# Patient Record
Sex: Male | Born: 1965 | Race: White | Hispanic: No | Smoking: Never smoker
Health system: Southern US, Community
[De-identification: ages and names within clinical notes are randomized; demographics above are authoritative.]

## PROBLEM LIST (undated history)

## (undated) DIAGNOSIS — K3580 Unspecified acute appendicitis: Secondary | ICD-10-CM

## (undated) DIAGNOSIS — R5383 Other fatigue: Secondary | ICD-10-CM

## (undated) DIAGNOSIS — K2970 Gastritis, unspecified, without bleeding: Secondary | ICD-10-CM

## (undated) DIAGNOSIS — T7840XA Allergy, unspecified, initial encounter: Secondary | ICD-10-CM

## (undated) DIAGNOSIS — C109 Malignant neoplasm of oropharynx, unspecified: Secondary | ICD-10-CM

## (undated) DIAGNOSIS — R634 Abnormal weight loss: Secondary | ICD-10-CM

## (undated) DIAGNOSIS — Z923 Personal history of irradiation: Secondary | ICD-10-CM

## (undated) DIAGNOSIS — Z8669 Personal history of other diseases of the nervous system and sense organs: Secondary | ICD-10-CM

## (undated) DIAGNOSIS — R51 Headache: Secondary | ICD-10-CM

## (undated) DIAGNOSIS — K1231 Oral mucositis (ulcerative) due to antineoplastic therapy: Secondary | ICD-10-CM

## (undated) DIAGNOSIS — Z85819 Personal history of malignant neoplasm of unspecified site of lip, oral cavity, and pharynx: Secondary | ICD-10-CM

## (undated) DIAGNOSIS — J3489 Other specified disorders of nose and nasal sinuses: Secondary | ICD-10-CM

## (undated) DIAGNOSIS — G43909 Migraine, unspecified, not intractable, without status migrainosus: Secondary | ICD-10-CM

## (undated) DIAGNOSIS — F329 Major depressive disorder, single episode, unspecified: Secondary | ICD-10-CM

## (undated) DIAGNOSIS — F32A Depression, unspecified: Secondary | ICD-10-CM

## (undated) DIAGNOSIS — K219 Gastro-esophageal reflux disease without esophagitis: Secondary | ICD-10-CM

## (undated) DIAGNOSIS — F419 Anxiety disorder, unspecified: Secondary | ICD-10-CM

## (undated) HISTORY — DX: Other fatigue: R53.83

## (undated) HISTORY — DX: Malignant neoplasm of oropharynx, unspecified: C10.9

## (undated) HISTORY — DX: Anxiety disorder, unspecified: F41.9

## (undated) HISTORY — DX: Depression, unspecified: F32.A

## (undated) HISTORY — DX: Migraine, unspecified, not intractable, without status migrainosus: G43.909

## (undated) HISTORY — DX: Personal history of irradiation: Z92.3

## (undated) HISTORY — DX: Oral mucositis (ulcerative) due to antineoplastic therapy: K12.31

## (undated) HISTORY — DX: Headache: R51

## (undated) HISTORY — DX: Major depressive disorder, single episode, unspecified: F32.9

## (undated) HISTORY — DX: Other specified disorders of nose and nasal sinuses: J34.89

## (undated) HISTORY — PX: NASAL SINUS SURGERY: SHX719

## (undated) HISTORY — PX: PEG TUBE PLACEMENT: SUR1034

## (undated) HISTORY — DX: Gastritis, unspecified, without bleeding: K29.70

## (undated) HISTORY — PX: PEG TUBE REMOVAL: SHX2187

## (undated) HISTORY — DX: Allergy, unspecified, initial encounter: T78.40XA

## (undated) HISTORY — DX: Abnormal weight loss: R63.4

---

## 1998-11-13 ENCOUNTER — Encounter: Payer: Self-pay | Admitting: Gastroenterology

## 1998-11-13 ENCOUNTER — Encounter: Payer: Self-pay | Admitting: Family Medicine

## 1998-11-13 ENCOUNTER — Other Ambulatory Visit: Admission: RE | Admit: 1998-11-13 | Discharge: 1998-11-13 | Payer: Self-pay | Admitting: Gastroenterology

## 2001-08-24 ENCOUNTER — Encounter: Payer: Self-pay | Admitting: Family Medicine

## 2001-08-24 ENCOUNTER — Encounter: Admission: RE | Admit: 2001-08-24 | Discharge: 2001-08-24 | Payer: Self-pay | Admitting: Family Medicine

## 2002-09-10 ENCOUNTER — Ambulatory Visit (HOSPITAL_COMMUNITY): Admission: RE | Admit: 2002-09-10 | Discharge: 2002-09-10 | Payer: Self-pay | Admitting: *Deleted

## 2002-09-10 ENCOUNTER — Encounter (INDEPENDENT_AMBULATORY_CARE_PROVIDER_SITE_OTHER): Payer: Self-pay | Admitting: Specialist

## 2003-05-14 ENCOUNTER — Encounter: Payer: Self-pay | Admitting: Family Medicine

## 2003-05-14 ENCOUNTER — Encounter: Admission: RE | Admit: 2003-05-14 | Discharge: 2003-05-14 | Payer: Self-pay | Admitting: Family Medicine

## 2003-06-28 ENCOUNTER — Encounter: Payer: Self-pay | Admitting: Gastroenterology

## 2003-08-20 ENCOUNTER — Encounter: Payer: Self-pay | Admitting: Gastroenterology

## 2004-09-07 ENCOUNTER — Ambulatory Visit: Payer: Self-pay | Admitting: Family Medicine

## 2004-10-23 ENCOUNTER — Ambulatory Visit: Payer: Self-pay | Admitting: Internal Medicine

## 2005-01-05 ENCOUNTER — Ambulatory Visit: Payer: Self-pay | Admitting: Family Medicine

## 2005-02-26 ENCOUNTER — Ambulatory Visit: Payer: Self-pay | Admitting: Family Medicine

## 2005-03-02 ENCOUNTER — Ambulatory Visit: Payer: Self-pay | Admitting: Family Medicine

## 2005-05-11 ENCOUNTER — Ambulatory Visit: Payer: Self-pay | Admitting: Family Medicine

## 2005-12-20 ENCOUNTER — Ambulatory Visit: Payer: Self-pay | Admitting: Family Medicine

## 2007-06-23 DIAGNOSIS — R51 Headache: Secondary | ICD-10-CM

## 2007-06-23 DIAGNOSIS — R519 Headache, unspecified: Secondary | ICD-10-CM | POA: Insufficient documentation

## 2007-06-23 DIAGNOSIS — J309 Allergic rhinitis, unspecified: Secondary | ICD-10-CM | POA: Insufficient documentation

## 2007-09-01 ENCOUNTER — Encounter: Payer: Self-pay | Admitting: Family Medicine

## 2007-09-26 ENCOUNTER — Telehealth: Payer: Self-pay | Admitting: Family Medicine

## 2007-09-26 ENCOUNTER — Ambulatory Visit: Payer: Self-pay | Admitting: Family Medicine

## 2007-09-26 DIAGNOSIS — M79609 Pain in unspecified limb: Secondary | ICD-10-CM

## 2008-11-08 ENCOUNTER — Ambulatory Visit: Payer: Self-pay | Admitting: Family Medicine

## 2008-11-08 DIAGNOSIS — K589 Irritable bowel syndrome without diarrhea: Secondary | ICD-10-CM

## 2008-11-12 ENCOUNTER — Telehealth: Payer: Self-pay | Admitting: *Deleted

## 2008-11-26 ENCOUNTER — Ambulatory Visit: Payer: Self-pay | Admitting: Gastroenterology

## 2009-01-13 ENCOUNTER — Ambulatory Visit: Payer: Self-pay | Admitting: Family Medicine

## 2009-01-13 DIAGNOSIS — R079 Chest pain, unspecified: Secondary | ICD-10-CM | POA: Insufficient documentation

## 2009-07-22 ENCOUNTER — Ambulatory Visit: Payer: Self-pay | Admitting: Family Medicine

## 2009-07-22 DIAGNOSIS — M542 Cervicalgia: Secondary | ICD-10-CM

## 2009-10-17 ENCOUNTER — Ambulatory Visit: Payer: Self-pay | Admitting: Family Medicine

## 2009-10-17 DIAGNOSIS — L301 Dyshidrosis [pompholyx]: Secondary | ICD-10-CM

## 2009-10-17 DIAGNOSIS — B369 Superficial mycosis, unspecified: Secondary | ICD-10-CM | POA: Insufficient documentation

## 2010-08-11 ENCOUNTER — Ambulatory Visit: Payer: Self-pay | Admitting: Family Medicine

## 2010-08-11 LAB — CONVERTED CEMR LAB
ALT: 51 units/L (ref 0–53)
AST: 37 units/L (ref 0–37)
Albumin: 4.5 g/dL (ref 3.5–5.2)
Alkaline Phosphatase: 105 units/L (ref 39–117)
BUN: 16 mg/dL (ref 6–23)
Basophils Absolute: 0 10*3/uL (ref 0.0–0.1)
Basophils Relative: 0.4 % (ref 0.0–3.0)
Bilirubin Urine: NEGATIVE
Bilirubin, Direct: 0.1 mg/dL (ref 0.0–0.3)
Blood in Urine, dipstick: NEGATIVE
CO2: 31 meq/L (ref 19–32)
Calcium: 9.2 mg/dL (ref 8.4–10.5)
Chloride: 98 meq/L (ref 96–112)
Cholesterol: 169 mg/dL (ref 0–200)
Creatinine, Ser: 1 mg/dL (ref 0.4–1.5)
Direct LDL: 103 mg/dL
Eosinophils Absolute: 0.1 10*3/uL (ref 0.0–0.7)
Eosinophils Relative: 1.4 % (ref 0.0–5.0)
GFR calc non Af Amer: 89.54 mL/min (ref >60)
Glucose, Bld: 71 mg/dL (ref 70–99)
Glucose, Urine, Semiquant: NEGATIVE
HCT: 44 % (ref 39.0–52.0)
HDL: 31.9 mg/dL — ABNORMAL LOW (ref >39.00)
Hemoglobin: 15 g/dL (ref 13.0–17.0)
Lymphocytes Relative: 19.8 % (ref 12.0–46.0)
Lymphs Abs: 1.6 10*3/uL (ref 0.7–4.0)
MCHC: 34 g/dL (ref 30.0–36.0)
MCV: 91.5 fL (ref 78.0–100.0)
Monocytes Absolute: 0.8 10*3/uL (ref 0.1–1.0)
Monocytes Relative: 10.7 % (ref 3.0–12.0)
Neutro Abs: 5.3 10*3/uL (ref 1.4–7.7)
Neutrophils Relative %: 67.7 % (ref 43.0–77.0)
Nitrite: NEGATIVE
Platelets: 193 10*3/uL (ref 150.0–400.0)
Potassium: 4 meq/L (ref 3.5–5.1)
Protein, U semiquant: NEGATIVE
RBC: 4.81 M/uL (ref 4.22–5.81)
RDW: 12.7 % (ref 11.5–14.6)
Sodium: 138 meq/L (ref 135–145)
Specific Gravity, Urine: 1.02
TSH: 0.98 microintl units/mL (ref 0.35–5.50)
Total Bilirubin: 0.5 mg/dL (ref 0.3–1.2)
Total CHOL/HDL Ratio: 5
Total Protein: 7.3 g/dL (ref 6.0–8.3)
Triglycerides: 209 mg/dL — ABNORMAL HIGH (ref 0.0–149.0)
Urobilinogen, UA: 0.2
VLDL: 41.8 mg/dL — ABNORMAL HIGH (ref 0.0–40.0)
WBC Urine, dipstick: NEGATIVE
WBC: 7.8 10*3/uL (ref 4.5–10.5)
pH: 7.5

## 2010-08-27 ENCOUNTER — Ambulatory Visit: Payer: Self-pay | Admitting: Family Medicine

## 2010-08-27 ENCOUNTER — Encounter: Payer: Self-pay | Admitting: Family Medicine

## 2010-11-01 DIAGNOSIS — C109 Malignant neoplasm of oropharynx, unspecified: Secondary | ICD-10-CM

## 2010-11-01 DIAGNOSIS — R634 Abnormal weight loss: Secondary | ICD-10-CM

## 2010-11-01 HISTORY — DX: Malignant neoplasm of oropharynx, unspecified: C10.9

## 2010-11-01 HISTORY — DX: Abnormal weight loss: R63.4

## 2010-12-03 NOTE — Assessment & Plan Note (Signed)
Summary: refill on medicine//lch   Vital Signs:  Patient profile:   45 year old male Weight:      190 pounds BMI:     27.36 Temp:     98.3 degrees F oral Pulse rate:   96 / minute Pulse rhythm:   regular BP sitting:   138 / 90  (left arm) Cuff size:   regular  Vitals Entered By: Mervin Hack CMA Duncan Dull) (August 11, 2010 11:49 AM) CC: medication refill   CC:  medication refill.  History of Present Illness: Jason Luna is a 45 year old male, divorced, who comes in today for renewing his medication to get set up for physical get a flu shot.  He would also like an HIV.  He recently had a relationship with a lady that didn't work out and just wants to be sure.  He takes defecating, 250 mg daily, and Elavil 50 nightly to prevent migraines.  He has a rare migraine, but when he does he usually only about once a month.  He takes Maxalt and Phenergan if he gets nauseated.  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 Benjiman Core  Past History:  Past medical, surgical, family and social histories (including risk factors) reviewed for relevance to current acute and chronic problems.  Past Medical History: Reviewed history from 11/22/2008 and no changes required. Allergic rhinitis Headache Gastritis  Past Surgical History: Reviewed history from 06/23/2007 and no changes required. Sinus surgery  Family History: Reviewed history from 11/22/2008 and no changes required. Family History of Asthma Family History of Prostate CA 1st degree relative <50 Family History of Skin cancer Fam hx Crohns dx-Mother Fam hx Leukemia  Social History: Reviewed history from 11/08/2008 and no changes required. Married Never Smoked Occupation: Alcohol use-no Drug use-ex  Review of Systems      See HPI  Physical Exam  General:  Well-developed,well-nourished,in  no acute distress; alert,appropriate and cooperative throughout examination   Impression & Recommendations:  Problem # 1:  HEADACHE (ICD-784.0) Assessment Improved  His updated medication list for this problem includes:    Maxalt 10 Mg Tabs (Rizatriptan benzoate) .Marland Kitchen... As needed for mha  Orders: Venipuncture (57322) TLB-Lipid Panel (80061-LIPID) TLB-BMP (Basic Metabolic Panel-BMET) (80048-METABOL) TLB-CBC Platelet - w/Differential (85025-CBCD) TLB-Hepatic/Liver Function Pnl (80076-HEPATIC) TLB-TSH (Thyroid Stimulating Hormone) (84443-TSH) T-HIV Antibody  (Reflex) (02542-70623) Prescription Created Electronically [phone]) UA Dipstick w/o Micro (manual) (15176)  Complete Medication List: 1)  Depakote 250 Mg Tbec (Divalproex sodium) .Marland Kitchen.. 1 tab at bedtime 2)  Promethazine Hcl 25 Mg Tabs (Promethazine hcl) .... As needed for mha 3)  Maxalt 10 Mg Tabs (Rizatriptan benzoate) .... As needed for mha 4)  Epipen 2-pak 0.3 Mg/0.70ml (1:1000) Devi (Epinephrine hcl (anaphylaxis)) .... As needed 5)  Amitriptyline Hcl 50 Mg Tabs (Amitriptyline hcl) .Marland Kitchen.. 1 tab @ bedtime  Other Orders: Flu Vaccine 52yrs + (16073) Admin 1st Vaccine (71062) Admin 1st Vaccine (State) [phone]) Specimen Handling (62703)  Patient Instructions: 1)  continue current medication.  Set up a 30 minute appointment sometime in the next couple weeks for a general physical exam.  All your laboratory done today. 2)  I will review your lab work with you when you come back.  If there is anything unusual.  I will call you immediately Prescriptions: AMITRIPTYLINE HCL 50 MG TABS (AMITRIPTYLINE HCL) 1 tab @ bedtime  #100 x  3   Entered and Authorized by:   Roderick Pee MD   Signed by:   Roderick Pee MD on 08/11/2010   Method used:   Electronically to        [facility]. [phone]* (retail)       [phone]       [place], [place]  [address]       Ph: [phone]       Fax: [phone]    RxID:   [id number] EPIPEN 2-PAK 0.3 MG/0.3ML (1:1000)  DEVI (EPINEPHRINE HCL (ANAPHYLAXIS)) as needed  #2 x 4   Entered and Authorized by:   Roderick Pee MD   Signed by:   Roderick Pee MD on 08/11/2010   Method used:   Electronically to        [facility]. [phone]* (retail)       [phone]       [place], [place]  [address]       Ph: [phone]       Fax: [phone]   RxID:   [id number] MAXALT 10 MG  TABS (RIZATRIPTAN BENZOATE) as needed for MHA  #6 x 11   Entered and Authorized by:   Roderick Pee MD   Signed by:   Roderick Pee MD on 08/11/2010   Method used:   Electronically to        [facility]. [phone]* (retail)       [phone] [address]       [place], [place]  [address]       Ph: [phone]       Fax: [phone]   RxID:   [id number] PROMETHAZINE HCL 25 MG  TABS (PROMETHAZINE HCL) as needed for MHA  #30 x 4   Entered and Authorized by:   Roderick Pee MD   Signed by:   Roderick Pee MD on 08/11/2010   Method used:   Electronically to        [facility]. [phone]* (retail)       [phone] [address]       [place], [place]  [address]       Ph: [phone]       Fax: [phone]   RxID:   [id number] DEPAKOTE 250 MG  TBEC (DIVALPROEX SODIUM) 1 tab at bedtime  #100 x 3   Entered and Authorized by:   Roderick Pee MD   Signed by:   Roderick Pee MD on 08/11/2010   Method used:   Electronically to        [facility]. [phone]* (retail)       [phone] [address]       [place], [place]  [address]       Ph: [phone]       Fax: [phone]   RxID:   [id number]   Current Allergies (reviewed today): ! * ALIEVE   Influenza Vaccine    Vaccine Type: Fluvax 3+    Site: left deltoid    Mfr: GlaxoSmithKline    Dose: 0.5 ml    Route: IM    Given by: Mervin Hack CMA (AAMA)    Exp. Date:  05/01/2011  Lot #: [id number]    VIS given: 05/26/10 version given August 11, 2010.  Flu Vaccine Consent Questions    Do you have a history of severe allergic reactions to this vaccine? no    Any prior history of allergic reactions to egg and/or gelatin? no    Do you have a sensitivity to the preservative Thimersol? no    Do you have a past history of Guillan-Barre Syndrome? no    Do you currently have an acute febrile illness? no    Have you ever had a severe reaction to latex? no    Vaccine information given and explained to patient? yes  Laboratory Results   Urine Tests    Routine Urinalysis   Color: yellow Appearance: Clear Glucose: negative   (Normal Range: Negative) Bilirubin: negative   (Normal Range: Negative) Ketone: trace (5)   (Normal Range: Negative) Spec. Gravity: 1.020   (Normal Range: 1.003-1.035) Blood: negative   (Normal Range: Negative) pH: 7.5   (Normal Range: 5.0-8.0) Protein: negative   (Normal Range: Negative) Urobilinogen: 0.2   (Normal Range: 0-1) Nitrite: negative   (Normal Range: Negative) Leukocyte Esterace: negative   (Normal Range: Negative)    Comments: Rita Ohara  August 11, 2010 4:07 PM

## 2010-12-03 NOTE — Assessment & Plan Note (Signed)
Summary: 30cpx/mm/pt rsc/cjr   Vital Signs:  Patient profile:   45 year old male Height:      70 inches Weight:      191 pounds BMI:     27.50 Temp:     99.3 degrees F oral BP sitting:   140 / 90  (left arm) Cuff size:   regular  Vitals Entered By: Kern Reap CMA Duncan Dull) (August 27, 2010 2:47 PM) CC: cpx Is Patient Diabetic? No   CC:  cpx.  History of Present Illness: Jason Luna is a 45 year old, divorced male, nonsmoker, who comes in today for general physical examination  His biggest long-term medical problem is the migraine headaches.  He takes Elavil 50 mg nightly, defecate, 250 milligrams nightly Maxalt p.r.n.  In the last month.  He said for migraines.  The Maxalt helps and he always takes immediately however, the last one he had on Wednesday lasted about 3 to 4 hours for finally went away.  He gets routine eye care, dental care, tetanus 99.........Marland Kitchen booster today......... seasonal flu shot 2011.  His father and grandfather had prostate cancer.  Therefore, [facility].  Exam /PSA have all been normal.  Three months ago.  He was given a testosterone supplement.  Information given about pluses and minuses  Allergies: 1)  ! Benjiman Core  Past History:  Past medical, surgical, family and social histories (including risk factors) reviewed, and no changes noted (except as noted below).  Past Medical History: Reviewed history from 11/22/2008 and no changes required. Allergic rhinitis Headache Gastritis  Past Surgical History: Reviewed history from 06/23/2007 and no changes required. Sinus surgery  Family History: Reviewed history from 11/22/2008 and no changes required. Family History of Asthma Family History of Prostate CA 1st degree relative <50 Family History of Skin cancer Fam hx Crohns dx-Mother Fam hx Leukemia  Social History: Reviewed history from 11/08/2008 and no changes required. Never  Smoked Occupation: Alcohol use-no Drug use-ex Divorced  Review of Systems      See HPI  Physical Exam  General:  Well-developed,well-nourished,in no acute distress; alert,appropriate and cooperative throughout examination Head:  Normocephalic and atraumatic without obvious abnormalities. No apparent alopecia or balding. Eyes:  No corneal or conjunctival inflammation noted. EOMI. Perrla. Funduscopic exam benign, without hemorrhages, exudates or papilledema. Vision grossly normal. Ears:  External ear exam shows no significant lesions or deformities.  Otoscopic examination reveals clear canals, tympanic membranes are intact bilaterally without bulging, retraction, inflammation or discharge. Hearing is grossly normal bilaterally. Nose:  External nasal examination shows no deformity or inflammation. Nasal mucosa are pink and moist without lesions or exudates. Mouth:  Oral mucosa and oropharynx without lesions or exudates.  Teeth in good repair. Neck:  No deformities, masses, or tenderness noted. Chest Wall:  No deformities, masses, tenderness or gynecomastia noted. Breasts:  No masses or gynecomastia noted Lungs:  Normal respiratory effort, chest expands symmetrically. Lungs are clear to auscultation, no crackles or wheezes. Heart:  Normal rate and regular rhythm. S1 and S2 normal without gallop, murmur, click, rub or other extra sounds. Abdomen:  Bowel sounds positive,abdomen soft and non-tender without masses, organomegaly or hernias noted. Rectal:  uro Genitalia:  Testes bilaterally descended without nodularity, tenderness or masses. No scrotal masses or lesions. No penis lesions or urethral discharge. Prostate:  uro Msk:  No deformity or scoliosis noted of thoracic or lumbar spine.   Pulses:  R and L carotid,radial,femoral,dorsalis pedis and posterior  tibial pulses are full and equal bilaterally Extremities:  No clubbing, cyanosis, edema, or deformity noted with normal full range of motion  of all joints.   Neurologic:  No cranial nerve deficits noted. Station and gait are normal. Plantar reflexes are down-going bilaterally. DTRs are symmetrical throughout. Sensory, motor and coordinative functions appear intact. Skin:  Intact without suspicious lesions or rashes Cervical Nodes:  No lymphadenopathy noted Axillary Nodes:  No palpable lymphadenopathy Inguinal Nodes:  No significant adenopathy Psych:  Cognition and judgment appear intact. Alert and cooperative with normal attention span and concentration. No apparent delusions, illusions, hallucinations   Impression & Recommendations:  Problem # 1:  ROUTINE GENERAL MEDICAL EXAM@HEALTH  CARE FACL (ICD-V70.0) Assessment Unchanged  Orders: EKG w/ Interpretation (93000)  Problem # 2:  HEADACHE (ICD-784.0) Assessment: Deteriorated  His updated medication list for this problem includes:    Maxalt 10 Mg Tabs (Rizatriptan benzoate) .Marland Kitchen... As needed for mha  Complete Medication List: 1)  Depakote 250 Mg Tbec (Divalproex sodium) .Marland Kitchen.. 1 tab at bedtime 2)  Promethazine Hcl 25 Mg Tabs (Promethazine hcl) .... As needed for mha 3)  Maxalt 10 Mg Tabs (Rizatriptan benzoate) .... As needed for mha 4)  Epipen 2-pak 0.3 Mg/0.52ml (1:1000) Devi (Epinephrine hcl (anaphylaxis)) .... As needed 5)  Amitriptyline Hcl 75 Mg Tabs (Amitriptyline hcl) .Marland Kitchen.. 1 tab @ bedtime 6)  Androgel Pump 1.25 Gm/act (1%) Gel (Testosterone) .... Uad via uro  Other Orders: Prescription Created Electronically [phone]) Tdap => 61yrs IM (60454) Admin 1st Vaccine (09811)  Patient Instructions: 1)  increase the elavil  to 75 mg a day at bedtime.........continue the Maxalt, and immediately at the onset of a migraine.  If we don't see improvement over the next month, then we would consider increasing the Elavil to 100 mg a day at bedtime or we can increase to depocate   Call if you have a problem 2)  Please schedule a follow-up appointment in 1 year. 3)  It is important that  you exercise regularly at least 20 minutes 5 times a week. If you develop chest pain, have severe difficulty breathing, or feel very tired , stop exercising immediately and seek medical attention. 4)  Take an Aspirin every day. 5)  Another good source of information about hormone replacement is Web- M.D. Prescriptions: AMITRIPTYLINE HCL 75 MG TABS (AMITRIPTYLINE HCL) 1 tab @ bedtime  #100 x 3   Entered and Authorized by:   Roderick Pee MD   Signed by:   Roderick Pee MD on 08/27/2010   Method used:   Electronically to        [facility]. [phone]* (retail)       [phone] [address]       [place], [place]  [address]       Ph: [phone]       Fax: [phone]   RxID:   [id number]    Orders Added: 1)  Prescription Created Electronically [G8553] 2)  Est. Patient 40-64 years [99396] 3)  EKG w/ Interpretation [93000] 4)  Tdap => 24yrs IM [90715] 5)  Admin 1st Vaccine [25956]   Immunizations Administered:  Tetanus Vaccine:    Vaccine Type: Tdap    Site: left deltoid    Mfr: GlaxoSmithKline    Dose: 0.5 ml    Route: IM    Given by: Kern Reap CMA (AAMA)    Exp. Date: 08/20/2012    Lot #: [id number]  Physician counseled: yes   Immunizations Administered:  Tetanus Vaccine:    Vaccine Type: Tdap    Site: left deltoid    Mfr: GlaxoSmithKline    Dose: 0.5 ml    Route: IM    Given by: Kern Reap CMA (AAMA)    Exp. Date: 08/20/2012    Lot #: [id number]    Physician counseled: yes

## 2011-03-02 ENCOUNTER — Ambulatory Visit (INDEPENDENT_AMBULATORY_CARE_PROVIDER_SITE_OTHER): Payer: 59 | Admitting: Family Medicine

## 2011-03-02 ENCOUNTER — Encounter: Payer: Self-pay | Admitting: Family Medicine

## 2011-03-02 DIAGNOSIS — L723 Sebaceous cyst: Secondary | ICD-10-CM

## 2011-03-02 DIAGNOSIS — IMO0002 Reserved for concepts with insufficient information to code with codable children: Secondary | ICD-10-CM

## 2011-03-02 NOTE — Progress Notes (Signed)
  Subjective:    Patient ID: Jason Luna, male    DOB: 07/30/1966, 45 y.o.   MRN: [id number]  HPI  Jason Luna is a 45 y/o div. Male nonsmoker who comes in  For evaluation of a knot on the right side of his neck x 3 weeks.  About 3 weeks he noticed a soreness in the right side of his neck and felt an enlarged lesion.  He says it increases in size and seems to shrink down the leg, but then gets bigger again.  He said no fever, chills, shortness of breath, chest pain, weight loss, recent dental work, Catering manager.    Review of Systems General review of systems negative    Objective:   Physical Exam    Will punish menarche distress.  Examination of the neck shows a marble-sized lesion just anterior to the parotid is soft is movable and not fixed to    Assessment & Plan:  Cystic lesion observed in P. Consult with Dr. Ezzard Standing, p.r.n.

## 2011-03-02 NOTE — Patient Instructions (Signed)
If the lesion does not resolve, then I would recommend you go see Dr. Narda Bonds.  ENT for consult.

## 2011-03-19 NOTE — Op Note (Signed)
   NAME:  Jason Luna, Jason Luna                       ACCOUNT NO.:  [id number]   MEDICAL RECORD NO.:  [id number]                   PATIENT TYPE:  OUT   LOCATION:  [facility]                                  FACILITY:  [facility]   PHYSICIAN:  Candy Sledge, M.D.            DATE OF BIRTH:  Mar 07, 1966   DATE OF PROCEDURE:  09/10/2002  DATE OF DISCHARGE:                                 OPERATIVE REPORT   PROCEDURE PERFORMED:  Lumbar puncture.   INDICATIONS FOR PROCEDURE:  Rule out demyelinating disease.   DESCRIPTION OF PROCEDURE:  After sterile preparation and local anesthesia at  the L3-4 interspace,  a 20 gauge spinal needle was introduced with moderate  difficulty producing clear CSF and opening pressure of 220 of H2O.  Approximately 12 cc were collected for routine studies.  The patient  tolerated the procedure well without immediate complications.                                               Candy Sledge, M.D.    JJS/MEDQ  D:  09/10/2002  T:  09/10/2002  Job:  [id number]

## 2011-06-07 ENCOUNTER — Other Ambulatory Visit (HOSPITAL_COMMUNITY)
Admission: RE | Admit: 2011-06-07 | Discharge: 2011-06-07 | Disposition: A | Discharge status: Home / Self Care | Payer: 59 | Source: Ambulatory Visit | Attending: Otolaryngology | Admitting: Otolaryngology

## 2011-06-07 DIAGNOSIS — R220 Localized swelling, mass and lump, head: Secondary | ICD-10-CM | POA: Insufficient documentation

## 2011-06-07 DIAGNOSIS — R221 Localized swelling, mass and lump, neck: Secondary | ICD-10-CM | POA: Insufficient documentation

## 2011-06-09 ENCOUNTER — Other Ambulatory Visit (HOSPITAL_COMMUNITY): Payer: Self-pay | Admitting: Otolaryngology

## 2011-06-09 DIAGNOSIS — C799 Secondary malignant neoplasm of unspecified site: Secondary | ICD-10-CM

## 2011-06-09 DIAGNOSIS — C099 Malignant neoplasm of tonsil, unspecified: Secondary | ICD-10-CM

## 2011-06-14 ENCOUNTER — Encounter (HOSPITAL_COMMUNITY)
Admission: RE | Admit: 2011-06-14 | Discharge: 2011-06-14 | Disposition: A | Discharge status: Home / Self Care | Payer: 59 | Source: Ambulatory Visit | Attending: Otolaryngology | Admitting: Otolaryngology

## 2011-06-14 DIAGNOSIS — C099 Malignant neoplasm of tonsil, unspecified: Secondary | ICD-10-CM | POA: Insufficient documentation

## 2011-06-14 DIAGNOSIS — C779 Secondary and unspecified malignant neoplasm of lymph node, unspecified: Secondary | ICD-10-CM | POA: Insufficient documentation

## 2011-06-14 DIAGNOSIS — C799 Secondary malignant neoplasm of unspecified site: Secondary | ICD-10-CM

## 2011-06-14 DIAGNOSIS — C50919 Malignant neoplasm of unspecified site of unspecified female breast: Secondary | ICD-10-CM | POA: Insufficient documentation

## 2011-06-14 LAB — GLUCOSE, CAPILLARY: Glucose-Capillary: 98 mg/dL (ref 70–99)

## 2011-06-14 MED ORDER — FLUDEOXYGLUCOSE F - 18 (FDG) INJECTION
18.2000 | Freq: Once | INTRAVENOUS | Status: AC | PRN
  Administered 2011-06-14: 18.2 via INTRAVENOUS

## 2011-06-15 ENCOUNTER — Ambulatory Visit
Admission: RE | Admit: 2011-06-15 | Discharge: 2011-06-15 | Disposition: A | Discharge status: Home / Self Care | Payer: 59 | Source: Ambulatory Visit | Attending: Radiation Oncology | Admitting: Radiation Oncology

## 2011-06-15 DIAGNOSIS — Z510 Encounter for antineoplastic radiation therapy: Secondary | ICD-10-CM | POA: Insufficient documentation

## 2011-06-15 DIAGNOSIS — Z800 Family history of malignant neoplasm of digestive organs: Secondary | ICD-10-CM | POA: Insufficient documentation

## 2011-06-15 DIAGNOSIS — C099 Malignant neoplasm of tonsil, unspecified: Secondary | ICD-10-CM | POA: Insufficient documentation

## 2011-06-15 DIAGNOSIS — Z806 Family history of leukemia: Secondary | ICD-10-CM | POA: Insufficient documentation

## 2011-06-15 DIAGNOSIS — C770 Secondary and unspecified malignant neoplasm of lymph nodes of head, face and neck: Secondary | ICD-10-CM | POA: Insufficient documentation

## 2011-06-17 ENCOUNTER — Other Ambulatory Visit (HOSPITAL_COMMUNITY): Payer: 59 | Admitting: Dentistry

## 2011-06-18 DIAGNOSIS — Z0189 Encounter for other specified special examinations: Secondary | ICD-10-CM

## 2011-06-18 DIAGNOSIS — C029 Malignant neoplasm of tongue, unspecified: Secondary | ICD-10-CM

## 2011-06-21 ENCOUNTER — Encounter (HOSPITAL_BASED_OUTPATIENT_CLINIC_OR_DEPARTMENT_OTHER): Payer: 59 | Admitting: Oncology

## 2011-06-21 ENCOUNTER — Other Ambulatory Visit: Payer: Self-pay | Admitting: Oncology

## 2011-06-21 DIAGNOSIS — C090 Malignant neoplasm of tonsillar fossa: Secondary | ICD-10-CM

## 2011-06-21 DIAGNOSIS — Z0189 Encounter for other specified special examinations: Secondary | ICD-10-CM

## 2011-06-21 LAB — COMPREHENSIVE METABOLIC PANEL
BUN: 18 mg/dL (ref 6–23)
CO2: 30 mEq/L (ref 19–32)
Creatinine, Ser: 1.12 mg/dL (ref 0.50–1.35)
Glucose, Bld: 96 mg/dL (ref 70–99)
Total Bilirubin: 0.6 mg/dL (ref 0.3–1.2)

## 2011-06-21 LAB — CBC WITH DIFFERENTIAL/PLATELET
Eosinophils Absolute: 0.1 10*3/uL (ref 0.0–0.5)
HCT: 42.7 % (ref 38.4–49.9)
LYMPH%: 26.3 % (ref 14.0–49.0)
MCHC: 34.5 g/dL (ref 32.0–36.0)
MCV: 90.1 fL (ref 79.3–98.0)
MONO#: 0.4 10*3/uL (ref 0.1–0.9)
MONO%: 6.9 % (ref 0.0–14.0)
NEUT#: 3.8 10*3/uL (ref 1.5–6.5)
NEUT%: 63.5 % (ref 39.0–75.0)
Platelets: 179 10*3/uL (ref 140–400)
WBC: 5.9 10*3/uL (ref 4.0–10.3)

## 2011-06-25 ENCOUNTER — Encounter (HOSPITAL_COMMUNITY): Payer: Self-pay

## 2011-06-25 ENCOUNTER — Ambulatory Visit: Payer: 59 | Attending: Oncology

## 2011-06-25 ENCOUNTER — Ambulatory Visit (HOSPITAL_COMMUNITY)
Admission: RE | Admit: 2011-06-25 | Discharge: 2011-06-25 | Disposition: A | Discharge status: Home / Self Care | Payer: 59 | Source: Ambulatory Visit | Attending: Oncology | Admitting: Oncology

## 2011-06-25 DIAGNOSIS — C099 Malignant neoplasm of tonsil, unspecified: Secondary | ICD-10-CM | POA: Insufficient documentation

## 2011-06-25 DIAGNOSIS — R599 Enlarged lymph nodes, unspecified: Secondary | ICD-10-CM | POA: Insufficient documentation

## 2011-06-25 DIAGNOSIS — IMO0001 Reserved for inherently not codable concepts without codable children: Secondary | ICD-10-CM | POA: Insufficient documentation

## 2011-06-25 DIAGNOSIS — C090 Malignant neoplasm of tonsillar fossa: Secondary | ICD-10-CM | POA: Insufficient documentation

## 2011-06-25 MED ORDER — IOHEXOL 300 MG/ML  SOLN
100.0000 mL | Freq: Once | INTRAMUSCULAR | Status: AC | PRN
  Administered 2011-06-25: 100 mL via INTRAVENOUS

## 2011-07-02 ENCOUNTER — Encounter (HOSPITAL_BASED_OUTPATIENT_CLINIC_OR_DEPARTMENT_OTHER): Payer: 59 | Admitting: Oncology

## 2011-07-02 DIAGNOSIS — Z5111 Encounter for antineoplastic chemotherapy: Secondary | ICD-10-CM

## 2011-07-02 DIAGNOSIS — C099 Malignant neoplasm of tonsil, unspecified: Secondary | ICD-10-CM

## 2011-07-02 DIAGNOSIS — B977 Papillomavirus as the cause of diseases classified elsewhere: Secondary | ICD-10-CM

## 2011-07-05 ENCOUNTER — Emergency Department (HOSPITAL_COMMUNITY)
Admission: EM | Admit: 2011-07-05 | Discharge: 2011-07-06 | Disposition: A | Discharge status: Home / Self Care | Payer: 59 | Attending: Emergency Medicine | Admitting: Emergency Medicine

## 2011-07-05 DIAGNOSIS — R112 Nausea with vomiting, unspecified: Secondary | ICD-10-CM | POA: Insufficient documentation

## 2011-07-05 DIAGNOSIS — R51 Headache: Secondary | ICD-10-CM | POA: Insufficient documentation

## 2011-07-05 DIAGNOSIS — Z85819 Personal history of malignant neoplasm of unspecified site of lip, oral cavity, and pharynx: Secondary | ICD-10-CM | POA: Insufficient documentation

## 2011-07-06 LAB — CBC
HCT: 39.8 % (ref 39.0–52.0)
Hemoglobin: 13.8 g/dL (ref 13.0–17.0)
MCH: 30.6 pg (ref 26.0–34.0)
MCV: 88.2 fL (ref 78.0–100.0)
RBC: 4.51 MIL/uL (ref 4.22–5.81)

## 2011-07-06 LAB — DIFFERENTIAL
Basophils Relative: 0 % (ref 0–1)
Lymphocytes Relative: 13 % (ref 12–46)
Lymphs Abs: 0.9 10*3/uL (ref 0.7–4.0)
Monocytes Relative: 12 % (ref 3–12)
Neutro Abs: 5.3 10*3/uL (ref 1.7–7.7)
Neutrophils Relative %: 74 % (ref 43–77)

## 2011-07-06 LAB — BASIC METABOLIC PANEL
CO2: 31 mEq/L (ref 19–32)
Calcium: 8.8 mg/dL (ref 8.4–10.5)
Glucose, Bld: 99 mg/dL (ref 70–99)
Sodium: 133 mEq/L — ABNORMAL LOW (ref 135–145)

## 2011-07-07 ENCOUNTER — Other Ambulatory Visit: Payer: Self-pay | Admitting: Oncology

## 2011-07-07 ENCOUNTER — Encounter (HOSPITAL_BASED_OUTPATIENT_CLINIC_OR_DEPARTMENT_OTHER): Payer: 59 | Admitting: Oncology

## 2011-07-07 DIAGNOSIS — C090 Malignant neoplasm of tonsillar fossa: Secondary | ICD-10-CM

## 2011-07-07 LAB — BASIC METABOLIC PANEL
BUN: 22 mg/dL (ref 6–23)
Glucose, Bld: 126 mg/dL — ABNORMAL HIGH (ref 70–99)
Potassium: 3.9 mEq/L (ref 3.5–5.3)

## 2011-07-08 ENCOUNTER — Encounter (HOSPITAL_COMMUNITY): Payer: 59 | Attending: Oncology

## 2011-07-08 DIAGNOSIS — C099 Malignant neoplasm of tonsil, unspecified: Secondary | ICD-10-CM | POA: Insufficient documentation

## 2011-07-09 ENCOUNTER — Encounter (HOSPITAL_COMMUNITY): Payer: 59

## 2011-07-14 ENCOUNTER — Other Ambulatory Visit: Payer: Self-pay | Admitting: Oncology

## 2011-07-14 ENCOUNTER — Encounter (HOSPITAL_BASED_OUTPATIENT_CLINIC_OR_DEPARTMENT_OTHER): Payer: Self-pay | Admitting: Oncology

## 2011-07-14 DIAGNOSIS — C090 Malignant neoplasm of tonsillar fossa: Secondary | ICD-10-CM

## 2011-07-14 LAB — BASIC METABOLIC PANEL
BUN: 19 mg/dL (ref 6–23)
Calcium: 9.4 mg/dL (ref 8.4–10.5)
Glucose, Bld: 107 mg/dL — ABNORMAL HIGH (ref 70–99)
Sodium: 136 mEq/L (ref 135–145)

## 2011-07-15 ENCOUNTER — Encounter (HOSPITAL_COMMUNITY): Payer: 59

## 2011-07-15 DIAGNOSIS — Z09 Encounter for follow-up examination after completed treatment for conditions other than malignant neoplasm: Secondary | ICD-10-CM

## 2011-07-15 DIAGNOSIS — C099 Malignant neoplasm of tonsil, unspecified: Secondary | ICD-10-CM

## 2011-07-16 ENCOUNTER — Encounter (HOSPITAL_COMMUNITY): Payer: 59

## 2011-07-16 ENCOUNTER — Encounter (HOSPITAL_BASED_OUTPATIENT_CLINIC_OR_DEPARTMENT_OTHER): Payer: 59 | Admitting: Oncology

## 2011-07-16 DIAGNOSIS — C090 Malignant neoplasm of tonsillar fossa: Secondary | ICD-10-CM

## 2011-07-17 ENCOUNTER — Encounter (HOSPITAL_BASED_OUTPATIENT_CLINIC_OR_DEPARTMENT_OTHER): Payer: 59 | Admitting: Oncology

## 2011-07-17 DIAGNOSIS — R5381 Other malaise: Secondary | ICD-10-CM

## 2011-07-19 ENCOUNTER — Ambulatory Visit (INDEPENDENT_AMBULATORY_CARE_PROVIDER_SITE_OTHER): Payer: 59 | Admitting: Family Medicine

## 2011-07-19 ENCOUNTER — Encounter: Payer: Self-pay | Admitting: Family Medicine

## 2011-07-19 VITALS — Temp 98.0°F | Wt 188.0 lb

## 2011-07-19 DIAGNOSIS — R51 Headache: Secondary | ICD-10-CM

## 2011-07-19 DIAGNOSIS — G43909 Migraine, unspecified, not intractable, without status migrainosus: Secondary | ICD-10-CM

## 2011-07-19 MED ORDER — HYDROCODONE-ACETAMINOPHEN 7.5-750 MG PO TABS: ORAL_TABLET | ORAL | Status: DC

## 2011-07-19 MED ORDER — TOPIRAMATE 50 MG PO TABS: ORAL_TABLET | ORAL | Status: DC

## 2011-07-19 NOTE — Patient Instructions (Signed)
Begin the Topamax one half tablet at bedtime for one week, then one full tablet at bedtime.  Vicodin ES one half to one tablet p.r.n. For breakthrough migraine.  Continue to take the Maxalt at the first sign of any migraine.  Return to weeks for follow-up.  Stoo depokate

## 2011-07-19 NOTE — Progress Notes (Signed)
  Subjective:    Patient ID: Jason Luna, male    DOB: 1966-09-10, 45 y.o.   MRN: [id number]  HPI Jason Luna is a 45 year old male, who comes in today because of his migraine headaches that have gotten worse.  We saw him in the summer with an enlarged node in his neck and referred him to Dr. Narda Luna, however, because of family issues, which included taking his father for treatment of pancreatic cancer.  He did not see him immediately.  Subsequently, was diagnosed with cancer.  He is now undergoing radiation and chemo.  The chemotherapy went well except the Zofran causes severe migraine, nausea and vomiting.  He had the emergency room.  Now a 3 migraines per week.  All of which are diminished by the Maxalt, but he would like to know if there is any other options   Review of Systems    Neurologic review of systems otherwise negative Objective:   Physical Exam  Well-developed well-nourished man no acute distress.  Examination HEENT were negative      Assessment & Plan:  Increase in migraine headaches.  Plan DC the defecate.  Start Topamax follow-up in two weeks

## 2011-07-23 ENCOUNTER — Other Ambulatory Visit: Payer: Self-pay | Admitting: Oncology

## 2011-07-23 ENCOUNTER — Encounter (HOSPITAL_BASED_OUTPATIENT_CLINIC_OR_DEPARTMENT_OTHER): Payer: 59 | Admitting: Oncology

## 2011-07-23 DIAGNOSIS — C090 Malignant neoplasm of tonsillar fossa: Secondary | ICD-10-CM

## 2011-07-23 LAB — CBC WITH DIFFERENTIAL/PLATELET
BASO%: 0.5 % (ref 0.0–2.0)
HCT: 40.8 % (ref 38.4–49.9)
MCHC: 35 g/dL (ref 32.0–36.0)
MONO#: 1 10*3/uL — ABNORMAL HIGH (ref 0.1–0.9)
RBC: 4.69 10*6/uL (ref 4.20–5.82)
RDW: 12.5 % (ref 11.0–14.6)
WBC: 4 10*3/uL (ref 4.0–10.3)
lymph#: 0.5 10*3/uL — ABNORMAL LOW (ref 0.9–3.3)
nRBC: 0 % (ref 0–0)

## 2011-07-23 LAB — COMPREHENSIVE METABOLIC PANEL
ALT: 34 U/L (ref 0–53)
AST: 24 U/L (ref 0–37)
Albumin: 5 g/dL (ref 3.5–5.2)
Calcium: 9.4 mg/dL (ref 8.4–10.5)
Chloride: 94 mEq/L — ABNORMAL LOW (ref 96–112)
Potassium: 4.1 mEq/L (ref 3.5–5.3)

## 2011-07-26 ENCOUNTER — Other Ambulatory Visit: Payer: Self-pay | Admitting: Oncology

## 2011-07-26 ENCOUNTER — Encounter (HOSPITAL_BASED_OUTPATIENT_CLINIC_OR_DEPARTMENT_OTHER): Payer: 59 | Admitting: Oncology

## 2011-07-26 DIAGNOSIS — Z5111 Encounter for antineoplastic chemotherapy: Secondary | ICD-10-CM

## 2011-07-26 DIAGNOSIS — B977 Papillomavirus as the cause of diseases classified elsewhere: Secondary | ICD-10-CM

## 2011-07-26 DIAGNOSIS — C090 Malignant neoplasm of tonsillar fossa: Secondary | ICD-10-CM

## 2011-07-26 LAB — CBC WITH DIFFERENTIAL/PLATELET
BASO%: 0.4 % (ref 0.0–2.0)
EOS%: 3.8 % (ref 0.0–7.0)
HCT: 39.5 % (ref 38.4–49.9)
MCH: 30.6 pg (ref 27.2–33.4)
MCHC: 34.7 g/dL (ref 32.0–36.0)
MCV: 88.2 fL (ref 79.3–98.0)
MONO%: 27.6 % — ABNORMAL HIGH (ref 0.0–14.0)
NEUT%: 58.6 % (ref 39.0–75.0)
RDW: 12.7 % (ref 11.0–14.6)
lymph#: 0.5 10*3/uL — ABNORMAL LOW (ref 0.9–3.3)

## 2011-07-26 LAB — COMPREHENSIVE METABOLIC PANEL
ALT: 29 U/L (ref 0–53)
AST: 23 U/L (ref 0–37)
Alkaline Phosphatase: 90 U/L (ref 39–117)
Calcium: 8.7 mg/dL (ref 8.4–10.5)
Chloride: 98 mEq/L (ref 96–112)
Creatinine, Ser: 0.95 mg/dL (ref 0.50–1.35)

## 2011-07-30 ENCOUNTER — Encounter (HOSPITAL_BASED_OUTPATIENT_CLINIC_OR_DEPARTMENT_OTHER): Payer: 59 | Admitting: Oncology

## 2011-07-30 ENCOUNTER — Other Ambulatory Visit: Payer: Self-pay | Admitting: Oncology

## 2011-07-30 DIAGNOSIS — R112 Nausea with vomiting, unspecified: Secondary | ICD-10-CM

## 2011-07-30 DIAGNOSIS — C090 Malignant neoplasm of tonsillar fossa: Secondary | ICD-10-CM

## 2011-07-30 DIAGNOSIS — R5381 Other malaise: Secondary | ICD-10-CM

## 2011-07-31 ENCOUNTER — Encounter: Payer: 59 | Admitting: Oncology

## 2011-08-01 LAB — BASIC METABOLIC PANEL
BUN: 43 mg/dL — ABNORMAL HIGH (ref 6–23)
CO2: 21 mEq/L (ref 19–32)
Chloride: 94 mEq/L — ABNORMAL LOW (ref 96–112)
Creatinine, Ser: 1.28 mg/dL (ref 0.50–1.35)
Glucose, Bld: 104 mg/dL — ABNORMAL HIGH (ref 70–99)
Potassium: 4.1 mEq/L (ref 3.5–5.3)

## 2011-08-02 ENCOUNTER — Encounter (HOSPITAL_BASED_OUTPATIENT_CLINIC_OR_DEPARTMENT_OTHER): Payer: 59 | Admitting: Oncology

## 2011-08-02 ENCOUNTER — Ambulatory Visit: Payer: Self-pay | Admitting: Family Medicine

## 2011-08-02 DIAGNOSIS — R112 Nausea with vomiting, unspecified: Secondary | ICD-10-CM

## 2011-08-02 DIAGNOSIS — R5383 Other fatigue: Secondary | ICD-10-CM

## 2011-08-02 DIAGNOSIS — R5381 Other malaise: Secondary | ICD-10-CM

## 2011-08-02 DIAGNOSIS — C090 Malignant neoplasm of tonsillar fossa: Secondary | ICD-10-CM

## 2011-08-03 ENCOUNTER — Encounter (HOSPITAL_BASED_OUTPATIENT_CLINIC_OR_DEPARTMENT_OTHER): Payer: 59 | Admitting: Oncology

## 2011-08-03 DIAGNOSIS — C090 Malignant neoplasm of tonsillar fossa: Secondary | ICD-10-CM

## 2011-08-04 ENCOUNTER — Other Ambulatory Visit: Payer: Self-pay | Admitting: Oncology

## 2011-08-04 ENCOUNTER — Ambulatory Visit (HOSPITAL_COMMUNITY): Payer: Self-pay

## 2011-08-04 ENCOUNTER — Encounter (HOSPITAL_BASED_OUTPATIENT_CLINIC_OR_DEPARTMENT_OTHER): Payer: Medicaid - Dental | Admitting: Oncology

## 2011-08-04 DIAGNOSIS — C090 Malignant neoplasm of tonsillar fossa: Secondary | ICD-10-CM

## 2011-08-04 DIAGNOSIS — B977 Papillomavirus as the cause of diseases classified elsewhere: Secondary | ICD-10-CM

## 2011-08-04 LAB — BASIC METABOLIC PANEL
BUN: 20 mg/dL (ref 6–23)
Calcium: 8.5 mg/dL (ref 8.4–10.5)
Creatinine, Ser: 0.97 mg/dL (ref 0.50–1.35)
Glucose, Bld: 88 mg/dL (ref 70–99)
Potassium: 3.9 mEq/L (ref 3.5–5.3)

## 2011-08-06 ENCOUNTER — Other Ambulatory Visit: Payer: Self-pay | Admitting: Oncology

## 2011-08-06 ENCOUNTER — Encounter (HOSPITAL_BASED_OUTPATIENT_CLINIC_OR_DEPARTMENT_OTHER): Payer: 59 | Admitting: Oncology

## 2011-08-06 DIAGNOSIS — C099 Malignant neoplasm of tonsil, unspecified: Secondary | ICD-10-CM

## 2011-08-06 DIAGNOSIS — B977 Papillomavirus as the cause of diseases classified elsewhere: Secondary | ICD-10-CM

## 2011-08-06 DIAGNOSIS — C090 Malignant neoplasm of tonsillar fossa: Secondary | ICD-10-CM

## 2011-08-06 LAB — BASIC METABOLIC PANEL
Calcium: 9 mg/dL (ref 8.4–10.5)
Potassium: 4 mEq/L (ref 3.5–5.3)
Sodium: 130 mEq/L — ABNORMAL LOW (ref 135–145)

## 2011-08-07 ENCOUNTER — Encounter: Payer: 59 | Admitting: Oncology

## 2011-08-09 ENCOUNTER — Ambulatory Visit (HOSPITAL_COMMUNITY)
Admission: RE | Admit: 2011-08-09 | Discharge: 2011-08-09 | Disposition: A | Discharge status: Home / Self Care | Payer: 59 | Source: Ambulatory Visit | Attending: Oncology | Admitting: Oncology

## 2011-08-09 DIAGNOSIS — Z01812 Encounter for preprocedural laboratory examination: Secondary | ICD-10-CM | POA: Insufficient documentation

## 2011-08-09 DIAGNOSIS — R633 Feeding difficulties, unspecified: Secondary | ICD-10-CM | POA: Insufficient documentation

## 2011-08-09 DIAGNOSIS — C099 Malignant neoplasm of tonsil, unspecified: Secondary | ICD-10-CM | POA: Insufficient documentation

## 2011-08-09 DIAGNOSIS — F411 Generalized anxiety disorder: Secondary | ICD-10-CM | POA: Insufficient documentation

## 2011-08-09 LAB — CBC
Hemoglobin: 11.1 g/dL — ABNORMAL LOW (ref 13.0–17.0)
MCH: 29.6 pg (ref 26.0–34.0)
MCV: 86.4 fL (ref 78.0–100.0)
Platelets: 144 10*3/uL — ABNORMAL LOW (ref 150–400)
RBC: 3.75 MIL/uL — ABNORMAL LOW (ref 4.22–5.81)
WBC: 6.3 10*3/uL (ref 4.0–10.5)

## 2011-08-09 LAB — PROTIME-INR: Prothrombin Time: 12.3 seconds (ref 11.6–15.2)

## 2011-08-09 MED ORDER — IOHEXOL 300 MG/ML  SOLN
50.0000 mL | Freq: Once | INTRAMUSCULAR | Status: AC | PRN
  Administered 2011-08-09: 10 mL via INTRAVENOUS

## 2011-08-10 ENCOUNTER — Encounter (HOSPITAL_BASED_OUTPATIENT_CLINIC_OR_DEPARTMENT_OTHER): Payer: 59 | Admitting: Oncology

## 2011-08-10 DIAGNOSIS — B977 Papillomavirus as the cause of diseases classified elsewhere: Secondary | ICD-10-CM

## 2011-08-10 DIAGNOSIS — C090 Malignant neoplasm of tonsillar fossa: Secondary | ICD-10-CM

## 2011-08-11 ENCOUNTER — Encounter (HOSPITAL_BASED_OUTPATIENT_CLINIC_OR_DEPARTMENT_OTHER): Payer: 59 | Admitting: Oncology

## 2011-08-11 ENCOUNTER — Ambulatory Visit (HOSPITAL_COMMUNITY)
Admission: RE | Admit: 2011-08-11 | Discharge: 2011-08-11 | Disposition: A | Discharge status: Home / Self Care | Payer: 59 | Source: Ambulatory Visit | Attending: Radiation Oncology | Admitting: Radiation Oncology

## 2011-08-11 ENCOUNTER — Other Ambulatory Visit: Payer: Self-pay | Admitting: Radiation Oncology

## 2011-08-11 ENCOUNTER — Other Ambulatory Visit: Payer: Self-pay | Admitting: Oncology

## 2011-08-11 DIAGNOSIS — C090 Malignant neoplasm of tonsillar fossa: Secondary | ICD-10-CM

## 2011-08-11 DIAGNOSIS — C109 Malignant neoplasm of oropharynx, unspecified: Secondary | ICD-10-CM

## 2011-08-11 DIAGNOSIS — R634 Abnormal weight loss: Secondary | ICD-10-CM

## 2011-08-11 DIAGNOSIS — Z931 Gastrostomy status: Secondary | ICD-10-CM | POA: Insufficient documentation

## 2011-08-11 DIAGNOSIS — R509 Fever, unspecified: Secondary | ICD-10-CM

## 2011-08-11 DIAGNOSIS — C099 Malignant neoplasm of tonsil, unspecified: Secondary | ICD-10-CM

## 2011-08-11 DIAGNOSIS — K1231 Oral mucositis (ulcerative) due to antineoplastic therapy: Secondary | ICD-10-CM

## 2011-08-11 DIAGNOSIS — B977 Papillomavirus as the cause of diseases classified elsewhere: Secondary | ICD-10-CM

## 2011-08-11 LAB — URINALYSIS, MICROSCOPIC - CHCC
Bilirubin (Urine): NEGATIVE
Blood: NEGATIVE
Glucose: NEGATIVE g/dL
Ketones: 5 mg/dL
Leukocyte Esterase: NEGATIVE
RBC count: NEGATIVE (ref 0–2)
pH: 6.5 (ref 4.6–8.0)

## 2011-08-11 LAB — CBC WITH DIFFERENTIAL/PLATELET
BASO%: 0 % (ref 0.0–2.0)
EOS%: 2.5 % (ref 0.0–7.0)
MCH: 30.2 pg (ref 27.2–33.4)
MCV: 88.1 fL (ref 79.3–98.0)
MONO%: 15.2 % — ABNORMAL HIGH (ref 0.0–14.0)
RBC: 3.44 10*6/uL — ABNORMAL LOW (ref 4.20–5.82)
RDW: 12.8 % (ref 11.0–14.6)
lymph#: 0.3 10*3/uL — ABNORMAL LOW (ref 0.9–3.3)
nRBC: 0 % (ref 0–0)

## 2011-08-12 ENCOUNTER — Encounter: Payer: Self-pay | Admitting: Oncology

## 2011-08-12 DIAGNOSIS — C109 Malignant neoplasm of oropharynx, unspecified: Secondary | ICD-10-CM | POA: Insufficient documentation

## 2011-08-12 DIAGNOSIS — R634 Abnormal weight loss: Secondary | ICD-10-CM | POA: Insufficient documentation

## 2011-08-12 DIAGNOSIS — K1231 Oral mucositis (ulcerative) due to antineoplastic therapy: Secondary | ICD-10-CM | POA: Insufficient documentation

## 2011-08-13 ENCOUNTER — Other Ambulatory Visit: Payer: Self-pay | Admitting: Oncology

## 2011-08-13 ENCOUNTER — Encounter (HOSPITAL_BASED_OUTPATIENT_CLINIC_OR_DEPARTMENT_OTHER): Payer: 59 | Admitting: Oncology

## 2011-08-13 DIAGNOSIS — C090 Malignant neoplasm of tonsillar fossa: Secondary | ICD-10-CM

## 2011-08-13 DIAGNOSIS — Z5111 Encounter for antineoplastic chemotherapy: Secondary | ICD-10-CM

## 2011-08-13 DIAGNOSIS — C099 Malignant neoplasm of tonsil, unspecified: Secondary | ICD-10-CM

## 2011-08-13 DIAGNOSIS — B977 Papillomavirus as the cause of diseases classified elsewhere: Secondary | ICD-10-CM

## 2011-08-13 LAB — URINE CULTURE

## 2011-08-13 LAB — CBC WITH DIFFERENTIAL/PLATELET
BASO%: 0 % (ref 0.0–2.0)
LYMPH%: 6.8 % — ABNORMAL LOW (ref 14.0–49.0)
MCHC: 34.9 g/dL (ref 32.0–36.0)
MONO#: 0.3 10*3/uL (ref 0.1–0.9)
NEUT#: 0.8 10*3/uL — ABNORMAL LOW (ref 1.5–6.5)
Platelets: 229 10*3/uL (ref 140–400)
RBC: 3.56 10*6/uL — ABNORMAL LOW (ref 4.20–5.82)
RDW: 13.4 % (ref 11.0–14.6)
WBC: 1.2 10*3/uL — ABNORMAL LOW (ref 4.0–10.3)
lymph#: 0.1 10*3/uL — ABNORMAL LOW (ref 0.9–3.3)

## 2011-08-13 LAB — COMPREHENSIVE METABOLIC PANEL
ALT: 24 U/L (ref 0–53)
AST: 19 U/L (ref 0–37)
Calcium: 9 mg/dL (ref 8.4–10.5)
Chloride: 94 mEq/L — ABNORMAL LOW (ref 96–112)
Creatinine, Ser: 1.07 mg/dL (ref 0.50–1.35)
Sodium: 133 mEq/L — ABNORMAL LOW (ref 135–145)
Total Protein: 6.7 g/dL (ref 6.0–8.3)

## 2011-08-15 ENCOUNTER — Emergency Department (HOSPITAL_COMMUNITY): Payer: 59

## 2011-08-15 ENCOUNTER — Emergency Department (HOSPITAL_COMMUNITY)
Admission: EM | Admit: 2011-08-15 | Discharge: 2011-08-16 | Disposition: A | Discharge status: Home / Self Care | Payer: 59 | Attending: Emergency Medicine | Admitting: Emergency Medicine

## 2011-08-15 DIAGNOSIS — R112 Nausea with vomiting, unspecified: Secondary | ICD-10-CM | POA: Insufficient documentation

## 2011-08-15 DIAGNOSIS — I10 Essential (primary) hypertension: Secondary | ICD-10-CM | POA: Insufficient documentation

## 2011-08-15 DIAGNOSIS — G43909 Migraine, unspecified, not intractable, without status migrainosus: Secondary | ICD-10-CM | POA: Insufficient documentation

## 2011-08-15 DIAGNOSIS — E860 Dehydration: Secondary | ICD-10-CM | POA: Insufficient documentation

## 2011-08-15 DIAGNOSIS — R05 Cough: Secondary | ICD-10-CM | POA: Insufficient documentation

## 2011-08-15 DIAGNOSIS — R059 Cough, unspecified: Secondary | ICD-10-CM | POA: Insufficient documentation

## 2011-08-15 DIAGNOSIS — R509 Fever, unspecified: Secondary | ICD-10-CM | POA: Insufficient documentation

## 2011-08-15 DIAGNOSIS — C109 Malignant neoplasm of oropharynx, unspecified: Secondary | ICD-10-CM | POA: Insufficient documentation

## 2011-08-15 DIAGNOSIS — Z79899 Other long term (current) drug therapy: Secondary | ICD-10-CM | POA: Insufficient documentation

## 2011-08-15 DIAGNOSIS — Z931 Gastrostomy status: Secondary | ICD-10-CM | POA: Insufficient documentation

## 2011-08-15 LAB — CBC
HCT: 30.7 % — ABNORMAL LOW (ref 39.0–52.0)
Hemoglobin: 10.7 g/dL — ABNORMAL LOW (ref 13.0–17.0)
MCH: 30.3 pg (ref 26.0–34.0)
MCHC: 34.9 g/dL (ref 30.0–36.0)

## 2011-08-15 LAB — DIFFERENTIAL
Basophils Relative: 1 % (ref 0–1)
Eosinophils Relative: 2 % (ref 0–5)
Lymphs Abs: 0.2 10*3/uL — ABNORMAL LOW (ref 0.7–4.0)
Monocytes Absolute: 1 10*3/uL (ref 0.1–1.0)

## 2011-08-15 LAB — BASIC METABOLIC PANEL
BUN: 17 mg/dL (ref 6–23)
Calcium: 9.4 mg/dL (ref 8.4–10.5)
GFR calc non Af Amer: 70 mL/min — ABNORMAL LOW (ref >90)
Glucose, Bld: 96 mg/dL (ref 70–99)

## 2011-08-16 ENCOUNTER — Inpatient Hospital Stay (HOSPITAL_COMMUNITY): Payer: 59

## 2011-08-16 ENCOUNTER — Inpatient Hospital Stay (HOSPITAL_COMMUNITY)
Admission: AD | Admit: 2011-08-16 | Discharge: 2011-08-20 | DRG: 392 | Disposition: A | Discharge status: Home Health | Payer: 59 | Source: Ambulatory Visit | Attending: Oncology | Admitting: Oncology

## 2011-08-16 ENCOUNTER — Encounter: Payer: Medicaid - Dental | Admitting: Oncology

## 2011-08-16 DIAGNOSIS — R112 Nausea with vomiting, unspecified: Principal | ICD-10-CM | POA: Diagnosis present

## 2011-08-16 DIAGNOSIS — E876 Hypokalemia: Secondary | ICD-10-CM | POA: Diagnosis present

## 2011-08-16 DIAGNOSIS — L988 Other specified disorders of the skin and subcutaneous tissue: Secondary | ICD-10-CM | POA: Diagnosis present

## 2011-08-16 DIAGNOSIS — D702 Other drug-induced agranulocytosis: Secondary | ICD-10-CM | POA: Diagnosis present

## 2011-08-16 DIAGNOSIS — R509 Fever, unspecified: Secondary | ICD-10-CM | POA: Diagnosis present

## 2011-08-16 DIAGNOSIS — Z923 Personal history of irradiation: Secondary | ICD-10-CM

## 2011-08-16 DIAGNOSIS — B977 Papillomavirus as the cause of diseases classified elsewhere: Secondary | ICD-10-CM | POA: Diagnosis present

## 2011-08-16 DIAGNOSIS — F3289 Other specified depressive episodes: Secondary | ICD-10-CM | POA: Diagnosis present

## 2011-08-16 DIAGNOSIS — K117 Disturbances of salivary secretion: Secondary | ICD-10-CM | POA: Diagnosis present

## 2011-08-16 DIAGNOSIS — G43909 Migraine, unspecified, not intractable, without status migrainosus: Secondary | ICD-10-CM | POA: Diagnosis present

## 2011-08-16 DIAGNOSIS — Z931 Gastrostomy status: Secondary | ICD-10-CM

## 2011-08-16 DIAGNOSIS — C109 Malignant neoplasm of oropharynx, unspecified: Secondary | ICD-10-CM | POA: Diagnosis present

## 2011-08-16 DIAGNOSIS — E871 Hypo-osmolality and hyponatremia: Secondary | ICD-10-CM | POA: Diagnosis not present

## 2011-08-16 DIAGNOSIS — Y842 Radiological procedure and radiotherapy as the cause of abnormal reaction of the patient, or of later complication, without mention of misadventure at the time of the procedure: Secondary | ICD-10-CM | POA: Diagnosis present

## 2011-08-16 DIAGNOSIS — Z9221 Personal history of antineoplastic chemotherapy: Secondary | ICD-10-CM

## 2011-08-16 DIAGNOSIS — K121 Other forms of stomatitis: Secondary | ICD-10-CM | POA: Diagnosis present

## 2011-08-16 DIAGNOSIS — T451X5A Adverse effect of antineoplastic and immunosuppressive drugs, initial encounter: Secondary | ICD-10-CM | POA: Diagnosis present

## 2011-08-16 DIAGNOSIS — E440 Moderate protein-calorie malnutrition: Secondary | ICD-10-CM | POA: Diagnosis present

## 2011-08-16 DIAGNOSIS — D6481 Anemia due to antineoplastic chemotherapy: Secondary | ICD-10-CM | POA: Diagnosis present

## 2011-08-16 DIAGNOSIS — F329 Major depressive disorder, single episode, unspecified: Secondary | ICD-10-CM | POA: Diagnosis present

## 2011-08-16 LAB — URINE MICROSCOPIC-ADD ON

## 2011-08-16 LAB — BASIC METABOLIC PANEL
BUN: 16 mg/dL (ref 6–23)
GFR calc Af Amer: >90 mL/min (ref >90)
GFR calc non Af Amer: >90 mL/min (ref >90)
Potassium: 3.2 mEq/L — ABNORMAL LOW (ref 3.5–5.1)
Sodium: 132 mEq/L — ABNORMAL LOW (ref 135–145)

## 2011-08-16 LAB — URINALYSIS, ROUTINE W REFLEX MICROSCOPIC
Hgb urine dipstick: NEGATIVE
Protein, ur: 30 mg/dL — AB
Urobilinogen, UA: 1 mg/dL (ref 0.0–1.0)

## 2011-08-17 LAB — BASIC METABOLIC PANEL
GFR calc Af Amer: >90 mL/min (ref >90)
GFR calc non Af Amer: >90 mL/min (ref >90)
Glucose, Bld: 115 mg/dL — ABNORMAL HIGH (ref 70–99)
Potassium: 3.1 mEq/L — ABNORMAL LOW (ref 3.5–5.1)
Sodium: 130 mEq/L — ABNORMAL LOW (ref 135–145)

## 2011-08-17 LAB — CBC
Hemoglobin: 9.8 g/dL — ABNORMAL LOW (ref 13.0–17.0)
MCHC: 34.9 g/dL (ref 30.0–36.0)

## 2011-08-18 LAB — BASIC METABOLIC PANEL
CO2: 32 mEq/L (ref 19–32)
Chloride: 94 mEq/L — ABNORMAL LOW (ref 96–112)
Glucose, Bld: 111 mg/dL — ABNORMAL HIGH (ref 70–99)
Potassium: 3.4 mEq/L — ABNORMAL LOW (ref 3.5–5.1)
Sodium: 132 mEq/L — ABNORMAL LOW (ref 135–145)

## 2011-08-18 LAB — URINE CULTURE
Culture  Setup Time: 201210170055
Culture: NO GROWTH
Special Requests: NEGATIVE

## 2011-08-18 LAB — CBC
Hemoglobin: 9.9 g/dL — ABNORMAL LOW (ref 13.0–17.0)
MCH: 30.5 pg (ref 26.0–34.0)
RBC: 3.25 MIL/uL — ABNORMAL LOW (ref 4.22–5.81)
WBC: 2.6 10*3/uL — ABNORMAL LOW (ref 4.0–10.5)

## 2011-08-19 LAB — BASIC METABOLIC PANEL
Calcium: 9.7 mg/dL (ref 8.4–10.5)
Creatinine, Ser: 0.89 mg/dL (ref 0.50–1.35)
GFR calc Af Amer: >90 mL/min (ref >90)
GFR calc non Af Amer: >90 mL/min (ref >90)
Sodium: 131 mEq/L — ABNORMAL LOW (ref 135–145)

## 2011-08-20 ENCOUNTER — Ambulatory Visit: Payer: 59 | Attending: Oncology

## 2011-08-20 DIAGNOSIS — C090 Malignant neoplasm of tonsillar fossa: Secondary | ICD-10-CM | POA: Insufficient documentation

## 2011-08-20 DIAGNOSIS — IMO0001 Reserved for inherently not codable concepts without codable children: Secondary | ICD-10-CM | POA: Insufficient documentation

## 2011-08-20 LAB — BASIC METABOLIC PANEL
BUN: 17 mg/dL (ref 6–23)
Creatinine, Ser: 0.9 mg/dL (ref 0.50–1.35)
Glucose, Bld: 120 mg/dL — ABNORMAL HIGH (ref 70–99)
Sodium: 132 mEq/L — ABNORMAL LOW (ref 135–145)

## 2011-08-21 NOTE — H&P (Signed)
NAME:  Jason Luna NO.:  [id number]  MEDICAL RECORD NO.:  [id number]  LOCATION:  1336                         FACILITY:  [facility]  PHYSICIAN:  Exie Parody, M.D.        DATE OF BIRTH:  [birth year]  DATE OF ADMISSION:  08/16/2011 DATE OF DISCHARGE:                             HISTORY & PHYSICAL   CHIEF COMPLAINT:  Intractable nausea, vomiting.  HISTORY OF PRESENT ILLNESS:  Jason Luna is a 45 year old Caucasian man with history of locally advanced oropharyngeal squamous cell carcinoma. He had a stage IV human papillomavirus, right tonsillar squamous cell carcinoma.  He was started on concurrent once every 3 weeks cisplatin and radiation therapy on July 02, 2011.  He received 2 doses of cisplatin and the last dose was 3 weeks ago.  He has been having intractable nausea and vomiting and low-grade fever.  He presented to clinic on Wednesday, August 11, 2011, with fever of 102.  He had blood culture drawn, it was negative.  PA and lateral x-ray was negative.  UA was negative and he was given empiric antibiotic with Levaquin.  He said that he still had fever up to 102 during the course of the day.  During this weekend, he was having intractable nausea and vomiting.  He does not tolerate Zofran because Zofran gives headache.  He has been trying Ativan, Compazine, and Phenergan suppositories and he still is not able to eat anything.  He is feeding himself some solution via the PEG tube; however, by gravity and he feels that some time that will cause him to have worsening nausea and vomiting.  He has been doing mouth rinses with salt, baking soda alternating with hydrogen peroxide and he still has a thick phlegm.  He reports some reddening of the skin in the right cervical neck from the therapy; however, no skin breakthrough.  He denies any residual lymph node swelling in the cervical neck.  He denies any major headache, confusion, shortness of breath, chest  pain, abdominal pain, PEG tube purulent discharge, erythema, bowel or  bladder incontinence, change in bowel or bladder habits, low back pain, lower extremity paraesthesia.  He declined intolerance or feeling of hopelessness.  The rest of the 14-point review of systems was negative.  PAST MEDICAL HISTORY: 1. Stage IV tonsillar squamous cell carcinoma as noted above. 2. History of depression and anxiety. 3. Chronic migraine headache. 4. Seasonal allergy. 5. 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  PAST SURGICAL HISTORY:  None.  CURRENT OUTPATIENT MEDICATIONS: 1. Amitriptyline 25 mg p.o. daily. 2. Compazine p.r.n. nausea, vomiting. 3. Fentanyl patch 25 mcg, change every 3 days. 4. Vicodin p.r.n. breakthrough pain. 5. Ativan p.r.n. nausea, vomiting. 6. Phenergan 25 mg PR b.i.d. p.r.n. nausea, vomiting. 7. Topiramate. 8. Ambien p.r.n.  ALLERGIES:  NAPROSYN which cause anaphylaxis.  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  Patient is divorced from his first wife.  Patient has 21 year old twin and a 43-year-old.  He has been with current fiancee for a year.  He drinks one or two beers a week.  He had history of cocaine abuse, however, not currently.  He  had a history of smoking.  FAMILY HISTORY:  Mother deceased in 71 from acute lymphocytic leukemia.  His father has stage IV pancreatic cancer.  His sister is 53 years old and is healthy.  A great grandfather with stomach cancer and paternal grandmother with lung cancer.  He deny any prior history of head and neck cancer.  PHYSICAL EXAMINATION:  VITAL SIGNS:  Temperature 99.5 degrees Fahrenheit.  His heart rate was 82, respiratory rate 18, blood pressure 105/74, weight of 166.6 pounds.  ECOG performance of 1.  Examination is positive for   I could not palpate any cervical adenopathy.  His PEG tube is dry, clean, and intact.  Normal bowel sounds without any increase in bowel sound or increase in  tympany.  Rest of the exam was negative.   General:  well-nourished in no acute distress.  Eyes:  no scleral icterus.  ENT: erythematous oropharynx from treatment with thick phlegm but no whitish exudates. Neck was without thyromegaly; JVD was not elevated.   Lymphatics:  Negative cervical, supraclavicular or axillary adenopathy.   Respiratory: lungs were clear bilaterally without wheezing or crackles.   Cardiovascular:  Regular rate and rhythm, S1/S2, without murmur, rub or  gallop.  There was no pedal edema.   GI:  abdomen was soft, flat, nontender, nondistended, without organomegaly.  PEG tube was inplace without erythema, purulent discharge or pain. Muscoloskeletal:  no spinal tenderness of palpation of vertebral spine.   Skin exam: skin changes in the cervical neck with flaky skin area; however, no breakthrough.   Neuro exam was nonfocal.  Patient was able to get on and off exam table  without assistance.  Gait was normal.  Patient was alerted and oriented.   Attention was good.   Language was appropriate.  Mood was slighly depressed.   Speech was not pressured.  Thought content was not tangential.    LABS:  Labs on August 13, 2011, showed WBC 1.2, hemoglobin 12.2, platelet count of 229.  Sodium 133, potassium 3.9, chloride 94, BUN 15, glucose 106, bicarb 15.  BUN 15, creatinine 1.07.  LFTs within normal limits, calcium  9.0.  IMAGINGS:  He had a PA and lateral chest x-ray performed on August 15, 2011, which did not show any evidence of pneumonia, effusions, edema, or pneumothorax.  ASSESSMENT AND PLAN: 1. Human papillomavirus positive oropharynx squamous cell carcinoma     undergoing concurrent chemoradiation therapy and he has finished     his radiation therapy on August 19, 2011. 2. Intractable nausea and vomiting. 3. Mucositis/xerostomia. 4. Neck changes from radiation therapy. 5. Depression.  IMPRESSION:  I discussed with [name] and his relative including  his sister and two family friends that intractable nausea and vomiting is secondary to most likely radiation therapy.  His last chemotherapy has been more than 3 weeks and this is unlikely to be a cisplatin-induced nausea, vomiting anymore.  He has oral therapy at home including Compazine, Ativan, and Phenergan p.r.n. and if he still has any problem, I advised him to get admitted to the hospital for continuous IV fluid and continuous PEG tube nutrition feeding.  I discussed with him that I will also go ahead and get a PA view to ensure any sign of obstruction, however unlikely.  PLAN:  The plan is for IV fluid and IV antiemetics with Compazine and add-on Reglan and hopefully he will appear to move better and have less nausea, vomiting.  I will refrain from Zofran because of Zofran gave migraine  headaches in the past for him.  I recommended him  to continue with radiation therapy per his radiation oncologist.  I recommend him to continue with salt and baking soda mouth rinses and hydrogen peroxide to get the thick phlegm out and  his Magic mouth wash p.r.n. mouth sore every six hours.  I will continue with amitriptyline for depression at home that he has.  For pain control, he has fentanyl patch 25 mcg to change every 3 days and use liquid Vicodin for breakthrough pain, dose moderate and use morphine sulfate IV for severe pain.  I advised on bowel regimen.  For his moderate type of malnutrition because of treatments of his cancer, I recommended a nutritional consult and we will go ahead and start him on continuous tube feed with Jevity at 40 cc an hour and go up to 70 cc an hour until nutritionist give other recommendation otherwise.  Code status:  Full code.  Prophylaxis:  He has Lovenox subcu 40 mg daily for DVT prophylaxis.     Exie Parody, M.D.     HTH/MEDQ  D:  08/16/2011  T:  08/17/2011  Job:  [id number]  cc:   Kristine Garbe. Ezzard Standing, M.D. Fax: [phone]  Maryln Gottron,  M.D. Fax: [phone]  Eugenio Hoes. Tawanna Cooler, MD [address] [place] [address]  Electronically Signed by Jethro Bolus MD on 08/21/2011 09:19:11 AM

## 2011-08-21 NOTE — Discharge Summary (Signed)
NAME:  Jason Luna, Jason Luna NO.:  [id number]  MEDICAL RECORD NO.:  [id number]  LOCATION:  1336                         FACILITY:  [facility]  PHYSICIAN:  Exie Parody, M.D.        DATE OF BIRTH:  07/12/1966  DATE OF ADMISSION:  08/16/2011 DATE OF DISCHARGE:  08/20/2011                              DISCHARGE SUMMARY   DISCHARGE DIAGNOSES: 1. Intractable nausea, vomiting. 2. Oropharynx, squamous cell carcinoma. 3. Mucositis/xerostomia. 4. Moderate calorie-protein malnutrition. 5. Hypokalemia. 6. Leukocytopenia and anemia. 7. Skin change from radiation therapy. 8. Depression. 9. History of migraine headaches.  IMAGING STUDY: 1. Portable KUB on August 16, 2011, which did not show any dilatation     of a loop of bowel to suggest obstruction.  BRIEF HPI:  Jason Luna is a 45 year old Caucasian man with history of HPV+ oropharynx squamous cell carcinoma.  He was started on every 3 weeks cisplatinum, the first dose was given on July 02, 2011, in addition to radiation.  He received 2 doses of chemotherapy, the last dose given on July 26, 2011.  For the last week prior to admission, he has been having worsening nausea, vomiting, and this became intractable.  He presented to the ER a day prior to admission and was given an IV antiemetic and went home; however, when I saw him on August 16, 2011, in clinic, he again had intractable nausea and vomiting.  He did not tolerate radiation therapy or any bolus tube feed, therefore the need for admission.  HOSPITAL COURSE: 1. Intractable nausea, vomiting:  This was most likely secondary to     radiation therapy versus xerostomia mucositis.  He was started on     IV antiemetics including Phenergan and Reglan in addition to oral     Compazine, Phenergan PR and oral Ativan.       I discussed with him the pros and cons of Reglan including     extrapyrimidal signs and he accepted the risks and benefits because he     was having  intractable nausea and vomiting.  He was able to     tolerate radiation therapy better with these IV antiemetics.     However, upon discontinuation of IV antibiotics, he had nausea,     vomiting again with radiation therapy and therefore on the last 2-     days of radiation therapy, he received IV antiemetics with Ativan     and IV Phenergan without any burning of the vein.  Over the last 24     hours of hospital course, he did not have any problem vomiting.  2. Oropharynx squamous cell carcinoma.  He did not receive the last     dose of cisplatin, which is the third dose because of decreased     performance status, grade 3 xerostomia, nausea, vomiting.  He is     planning to have a follow up PET scan post treatment in about 3     months from now to assess disease status.  He finished the last     dosage of therapy in Hospital, on August 19, 2011. 3. Anemia, leukocytopenia, secondary to chemotherapy.  He  had low-     grade fever at home and he was started on empiric antibiotic with     Levaquin on August 11, 2011.  During the hospital course, he did    not have any fever.  His urine culture was negative.  He finished     his 10-day course of Levaquin in the hospital and there was no     indication for further antibiotic empiric coverage. 4. Xerostomia/mucositis.  He got Fentanyl patch 50 mcg/hour, change     every 3 days in addition to Hydrocodone/Tylenol elixir, which     controlled this pain.  He was instructed to do mouth rinses more     frequently including hydrogen peroxide every 2 or 3 hours while     awake to decrease his phlegm, which did happen. 5. Moderate calorie-protein malnutrition.  He was evaluated by     Nutritional with recommendations for Jevity 1.5 with goal at 65.     He was started at 40 mL an hour and was at goal upon discharge.  He     received IV fluids the first few days of the hospital course;     however, the last 2 days the IV fluid was discontinued and he  was     started on free water bolus.  The goal was at free water 240     mL every 6 hours, however, he had a lot of problem with nausea and     vomiting with a high dose and therefore it was decreased down to     free water 120 mL every 2 to 3 hours while awake, and he tolerated     that pretty well.  He also received protein liquid in the form of     Pro-Stat and tolerated that well. 6. Hypokalemia secondary to nausea and vomiting.  He received IV     potassium during the hospital course times 1 and then PEG tube     potassium during the hospital course with normal potassium upon     discharge.  Therefore, he did not need potassium at home. 7. History of depression.  His mood improved on amitriptyline as his      nausea/vomiting improved during the hospital course. 8. History of migraine headache.  He has Topamax.  DISCHARGE EXAMINATION:  Temperature was 98.3, heart rate 97, respiratory 18, blood pressure 113/67, O2 saturations 99% on room air.  Examination was negative except for oropharynx erythema from radiation therapy in addition to erythema on the right cervical neck; however, I could not palpate any cervical adenopathy anywhere.  PEG tube was dry, clean, and intact.  DISCHARGE LABORATORY DATA:  Creatinine 0.9, calcium  9.4.  DISCHARGE CONDITION:  Improved.  DISCHARGE DIET:  Soft diet as tolerated, by mouth; however, PEG tube feeding continuously, pump with Jevity 1.5 at 65 mL an hour with help of advanced home care.  DISCHARGE FOLLOWUP:  With Dr. Dayton Scrape and with me within 2 to 3 weeks.  DISCHARGE MEDICATIONS: 1. Fentanyl patch 50 mcg per hour, change every 2 days. 2. Free water 120 mL every 2 hours via feed tube.  Hydrocodone/Tylenol     7.5/500 mg/18 mL every 6 hours p.r.n. breakthrough pain. 3. Hydrogen peroxide diluted solutions every 2 to 3 hours for phlegm. 4. Jevity 1.5 feeding tube via pump continuously at 65 mL an hour. 5. Ativan 0.5 mg p.o. every 6 hours p.r.n.  nausea, vomiting. 6. Mouthwash p.r.n. 7. Reglan 5 mg per  5 mL via the PEG tube every 6 hours p.r.n. nausea,     vomiting x 3-4 weeks total to decrease risk of side effects. 8. Compazine 10 mg via tube every 6 hours p.r.n. nausea, vomiting. 9. Phenergan 25 mg suppository daily p.r.n. nausea vomiting. 10.Protein/ProStat 30 mL b.i.d. via PEG. 11.Senna p.r.n. 12.Ambien p.r.n. 13.Amitriptyline 75 mg p.o. or via PEG nightly. 14.Protonix 40 mg p.o. or via PEG tube every a.m. 15.RadiaPlex with instruction to the skin. 16.Topiramate 50 mg by mouth or via PEG tube nightly.     Exie Parody, M.D.     HTH/MEDQ  D:  08/20/2011  T:  08/20/2011  Job:  [id number]  Electronically Signed by Jethro Bolus MD on 08/21/2011 09:24:51 AM

## 2011-08-24 ENCOUNTER — Ambulatory Visit
Admission: RE | Admit: 2011-08-24 | Discharge: 2011-08-24 | Disposition: A | Discharge status: Home / Self Care | Payer: 59 | Source: Ambulatory Visit | Attending: Radiation Oncology | Admitting: Radiation Oncology

## 2011-08-24 DIAGNOSIS — C099 Malignant neoplasm of tonsil, unspecified: Secondary | ICD-10-CM | POA: Insufficient documentation

## 2011-08-24 DIAGNOSIS — C50919 Malignant neoplasm of unspecified site of unspecified female breast: Secondary | ICD-10-CM | POA: Insufficient documentation

## 2011-09-03 ENCOUNTER — Ambulatory Visit (HOSPITAL_BASED_OUTPATIENT_CLINIC_OR_DEPARTMENT_OTHER): Payer: Medicaid - Dental | Admitting: Oncology

## 2011-09-03 ENCOUNTER — Telehealth: Payer: Self-pay | Admitting: Oncology

## 2011-09-03 ENCOUNTER — Other Ambulatory Visit: Payer: Self-pay | Admitting: Oncology

## 2011-09-03 DIAGNOSIS — C109 Malignant neoplasm of oropharynx, unspecified: Secondary | ICD-10-CM

## 2011-09-03 DIAGNOSIS — C090 Malignant neoplasm of tonsillar fossa: Secondary | ICD-10-CM

## 2011-09-03 DIAGNOSIS — Z5111 Encounter for antineoplastic chemotherapy: Secondary | ICD-10-CM

## 2011-09-03 DIAGNOSIS — B977 Papillomavirus as the cause of diseases classified elsewhere: Secondary | ICD-10-CM

## 2011-09-03 DIAGNOSIS — C099 Malignant neoplasm of tonsil, unspecified: Secondary | ICD-10-CM

## 2011-09-03 LAB — COMPREHENSIVE METABOLIC PANEL
BUN: 26 mg/dL — ABNORMAL HIGH (ref 6–23)
CO2: 31 mEq/L (ref 19–32)
Calcium: 9.6 mg/dL (ref 8.4–10.5)
Chloride: 95 mEq/L — ABNORMAL LOW (ref 96–112)
Creatinine, Ser: 1.08 mg/dL (ref 0.50–1.35)
Glucose, Bld: 94 mg/dL (ref 70–99)

## 2011-09-03 LAB — CBC WITH DIFFERENTIAL/PLATELET
BASO%: 0.5 % (ref 0.0–2.0)
Basophils Absolute: 0 10*3/uL (ref 0.0–0.1)
EOS%: 0.6 % (ref 0.0–7.0)
HCT: 32.4 % — ABNORMAL LOW (ref 38.4–49.9)
HGB: 11.1 g/dL — ABNORMAL LOW (ref 13.0–17.1)
MCH: 31.2 pg (ref 27.2–33.4)
MCHC: 34.1 g/dL (ref 32.0–36.0)
MCV: 91.6 fL (ref 79.3–98.0)
MONO%: 13.4 % (ref 0.0–14.0)
NEUT%: 81.4 % — ABNORMAL HIGH (ref 39.0–75.0)
lymph#: 0.3 10*3/uL — ABNORMAL LOW (ref 0.9–3.3)

## 2011-09-03 NOTE — Telephone Encounter (Signed)
gve the pt his jan 2013 appt calendar along with the pet scan appt.

## 2011-09-04 ENCOUNTER — Encounter: Payer: Self-pay | Admitting: *Deleted

## 2011-09-06 NOTE — Progress Notes (Signed)
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  CURRENT THERAPY:  watchful observation.  INTERIM HISTORY:  Jason Luna returns to the clinic with his fiance for followup.  He finished chemoradiation therapy about 3 weeks ago.  He reports that he has been trying to eat by mouth, taking in some water, juice, and ginger ale.  He uses the PEG tube with continuous pump during the daytime at 65 mL an hour and at night.  At dinner, he likes to eat something himself or give a bolus, so that he does not have to have it a night.  He still has plenty of xerostomia and thick phlegm despite doing hydrogen peroxide and baking soda.  He has dysphagia when he takes in certain kinds of food.  He requires liquid Vicodin about 6 hours.  He still has a fentanyl patch on at 50 mcg per hour that is changed every 3 days.  He has some coughing and gagging in the morning, especially with thick phlegm.  He denies any tinnitus, hearing loss, shortness of breath, chest pain, abdominal pain, or PEG tube purulent discharge or erythema.  REVIEW OF SYSTEMS:  The rest of the 14-point review of systems was negative.  CURRENT MEDICATIONS:   1. Amitriptyline 25 mg p.o. daily. 2. Compazine p.r.n. nausea and vomiting. 3. Fentanyl patch 50 mcg per hour to be changed every 3 days. 4. Vicodin every 6 hours p.r.n. breakthrough pain. 5. Ativan 0.5 mg p.o. q.6 hours p.r.n. anxiety, nausea or vomiting. 6. Zofran 8 mg p.o. q.12 hours p.r.n. nausea or vomiting. 7. Phenergan p.r.n. nausea or vomiting. 8. Ambien p.r.n. insomnia. 9. Topiramate nightly.  PHYSICAL EXAMINATION:  Vital Signs:  Temperature 97.4.  Heart rate is 90.  Respiratory rate 20.  Blood pressure 108/70.  Weight is 158.1 pounds.  ECOG performance status of 1.  General:  thin-appearing man in no acute distress.  Eyes:  no scleral icterus.  Neck: skin  of the neck was dry; however, there was no active skin lesion.  I could not feel any neck adenopathy, especially on the right cervical neck.  HEENT:  Oropharynx was slightly erythematous from radiation therapy.  However, no clear whitish exudate or ulcerative discharge. Respiratory: lungs were clear bilaterally without wheezing or crackles.  Cardiovascular:  Regular rate and rhythm, S1/S2, without murmur, rub or gallop.  There was no pedal edema.  GI:  abdomen was soft, flat, nontender, nondistended, without organomegaly.  PEG tube was dry, clean, and intact.Muscoloskeletal:  no spinal tenderness of palpation of vertebral spine.  Skin exam was without echymosis, petichae.  Neuro exam was nonfocal.  Patient was able to get on and off exam table without assistance.  Gait was normal.  Patient was alerted and oriented.  Attention was good.   Language was appropriate.  Mood was normal without depression.  Speech was not pressured.  Thought content was not tangential.    LABORATORY DATA:  WBC 6.2, hemoglobin 11.1, platelet count of 277.  Creatinine 1.08, AST 46, ALT 58.  The rest of the LFTs were within normal limits.  Calcium 9.6.  ASSESSMENT AND PLAN:   1. Oropharynx squamous cell carcinoma:  I discussed with Jason Luna and his girlfriend that he has clinical response on  physical exam.  There is no cervical adenopathy anymore.  I went ahead and ordered a PET scan to be performed in early January 2013  to assess the response to chemoradiation therapy. 2. Calorie-protein malnutrition:  This is moderate.  His weight is down compared to before.  He had turned down his feeding tube at nighttime a little bit and reduced during the daytime and is trying to eat more regular food.  I advised him to eat more creamy-based food and avoid tomato or acid-base food for now to avoid worsening his mucositis.  3. Mucositis from chemoradiation therapy:  This is slightly improving.  He is on a fentanyl patch 63mcg/hr and Vicodin  liquid pain medications.  When he comes back for a refill of Vicodin for pain medication, the fentanyl patch will be decreased to 25 mcg per hour. 4. Anxiety:  I discussed with him that this is  a normal response in patients who has just finished  a difficult  treatment course.  He denied suicidal/homicidal ideation.  He is on antidepressants per primary care physician.  I gave him a prescription for Ativan.  I do not advocate Valium or Xanax as these are quite addictive.  However, I advised him to talk to his primary care physician about if he has persistent generalized anxiety disorder.  5. Slightly elevated liver function tests secondary to recent chemotherapy:  This is mild.  There is no concern for hepatitis at this time. 6. Xerostomia secondary to chemoradiation therapy:  I advised him to continue with mouth rinse using salt and baking soda and hydrogen peroxide.  However, I also added on viscous lidocaine/Robitusin mixture. 7. Follow up with me in clinic after restaging PET scan in January 2013.     ______________________________ Exie Parody, M.D. HTH/MEDQ  D:  09/03/2011  T:  09/06/2011  Job:  [id number]

## 2011-09-07 ENCOUNTER — Other Ambulatory Visit: Payer: Self-pay | Admitting: Family Medicine

## 2011-09-15 ENCOUNTER — Ambulatory Visit (HOSPITAL_COMMUNITY)
Admission: RE | Admit: 2011-09-15 | Discharge: 2011-09-15 | Disposition: A | Discharge status: Home / Self Care | Payer: 59 | Source: Ambulatory Visit | Attending: Oncology | Admitting: Oncology

## 2011-09-15 ENCOUNTER — Other Ambulatory Visit: Payer: Self-pay | Admitting: *Deleted

## 2011-09-15 DIAGNOSIS — C109 Malignant neoplasm of oropharynx, unspecified: Secondary | ICD-10-CM

## 2011-09-15 NOTE — Progress Notes (Signed)
Patient's g tube assessed.  Flushes easily.  Patient concerned that appearance of food particles in clear tubing means that the gastrostomy is clogged.  Patient educated that if able to flush tubing, then g tube is not clogged and that food particles will appear in the tube.  Patient verbalized understanding.  Dr. Gaylyn Rong notified and imaging order was canceled.

## 2011-09-15 NOTE — Progress Notes (Signed)
Pt arrived to [facility] lobby and reports his PEG tube is repeatedly clogged in spite of his using interventions such as flushing w/ Coke.  It is currently clogged.  He denies any n/v. Called [facility] IR and s/w Inetta Fermo who got pt appt for PEG to be evaluated at 1pm today.  Instructed pt to go to [facility] at 1pm to have tube evaluated.  He verbalized understanding.

## 2011-09-16 ENCOUNTER — Telehealth: Payer: Self-pay | Admitting: *Deleted

## 2011-09-16 ENCOUNTER — Other Ambulatory Visit: Payer: Self-pay | Admitting: Oncology

## 2011-09-16 DIAGNOSIS — B370 Candidal stomatitis: Secondary | ICD-10-CM

## 2011-09-16 MED ORDER — DIFLUCAN 10 MG/ML PO SUSR: 100.0000 mg | Freq: Every day | ORAL | Status: AC

## 2011-09-16 NOTE — Telephone Encounter (Signed)
Pt called reporting a new onset of sore throat since last night.  States the Hospice RN for his father actually examined his mouth and reports a white coating. She told pt he may have thrush and should call Dr. Gaylyn Rong.  Informed pt I will call him back after reporting to Dr. Gaylyn Rong.

## 2011-09-16 NOTE — Telephone Encounter (Signed)
Place of patient coming to see you to confirm that he does have or thrush before starting something for it.  Thanks.

## 2011-09-16 NOTE — Telephone Encounter (Signed)
Called pt to come into office to assess for possible thrush.  Pt seen by this RN and Dr. Gaylyn Rong.  Rx sent to [facility] for Diflucan and pt verbalized understanding.

## 2011-09-17 ENCOUNTER — Encounter: Payer: Self-pay | Admitting: Radiation Oncology

## 2011-09-20 ENCOUNTER — Ambulatory Visit: Payer: 59 | Attending: Oncology

## 2011-09-20 DIAGNOSIS — C090 Malignant neoplasm of tonsillar fossa: Secondary | ICD-10-CM | POA: Insufficient documentation

## 2011-09-20 DIAGNOSIS — IMO0001 Reserved for inherently not codable concepts without codable children: Secondary | ICD-10-CM | POA: Insufficient documentation

## 2011-09-21 ENCOUNTER — Encounter: Payer: Self-pay | Admitting: Radiation Oncology

## 2011-09-21 ENCOUNTER — Encounter (HOSPITAL_COMMUNITY): Payer: Self-pay | Admitting: Dentistry

## 2011-09-21 ENCOUNTER — Encounter: Payer: 59 | Admitting: Nutrition

## 2011-09-21 ENCOUNTER — Ambulatory Visit (HOSPITAL_COMMUNITY): Payer: Medicaid - Dental | Admitting: Dentistry

## 2011-09-21 ENCOUNTER — Ambulatory Visit
Admission: RE | Admit: 2011-09-21 | Discharge: 2011-09-21 | Disposition: A | Discharge status: Home / Self Care | Payer: 59 | Source: Ambulatory Visit | Attending: Radiation Oncology | Admitting: Radiation Oncology

## 2011-09-21 VITALS — BP 103/73 | HR 85 | Temp 98.9°F | Resp 18 | Ht 69.3 in | Wt 153.1 lb

## 2011-09-21 VITALS — BP 93/66 | HR 85 | Temp 98.9°F

## 2011-09-21 DIAGNOSIS — K117 Disturbances of salivary secretion: Secondary | ICD-10-CM

## 2011-09-21 DIAGNOSIS — R1310 Dysphagia, unspecified: Secondary | ICD-10-CM

## 2011-09-21 DIAGNOSIS — R439 Unspecified disturbances of smell and taste: Secondary | ICD-10-CM

## 2011-09-21 DIAGNOSIS — R432 Parageusia: Secondary | ICD-10-CM

## 2011-09-21 DIAGNOSIS — C109 Malignant neoplasm of oropharynx, unspecified: Secondary | ICD-10-CM

## 2011-09-21 MED ORDER — FENTANYL 25 MCG/HR TD PT72: 1.0000 | MEDICATED_PATCH | TRANSDERMAL | Status: DC

## 2011-09-21 NOTE — Progress Notes (Signed)
Mr. Fronczak reports that he no longer uses Jevity 1.5 via his feeding tube.  He reports that he is using Boost Plus 5 cans daily through his feeding tube.  He has begun to tolerate some liquids and soft solid foods, such as ginger ale, milk, cream soups, Jell-O, and applesauce.  He is planning on trying some sweet potatoes and some thicker soft moist foods.  He reports that his recent diagnosis of thrush has affected his ability to eat, although he feels like this is getting some better.  He has had significant weight loss of a total of 16% from his usual body weight.  His weight was documented as 158 pounds on November 16 which is decreased from a usual body weight of 189 pounds and a weight of 169.6 pounds documented on October 9.  The patient requires approximately 2400 calories and 125 g of protein daily to meet his minimum estimated needs.  Five cans of Boost Plus provide 1800 calorie and 70 mg of protein.  The patient understands that this is less than what he needs.  He is going to try to add some whey protein powder to his feeding tube.  He is also going to try to increase his caloric foods to promote weight gain.  NUTRITION DIAGNOSIS:  Of unintended weight loss continues.  INTERVENTION:  I have encouraged the patient to continue Boost Plus 5 cans daily via PEG and work on increasing his oral intake throughout the day.  The patient understands that he needs approximately 7 cans of a "plus formula" in order to meet his minimum caloric needs.  He is going to try to drink these supplements if he cannot increase his oral intake of foods.  The patient is to continue to increase his fluid intake.  He understands that he needs to maintain his weight or gain weight on oral intake alone for at least 2 weeks in order for his feeding tube to even be considered to be removed.  MONITORING, EVALUATION, AND GOALS:  The patient will tolerate tube feedings plus oral intake to promote weight gain.  NEXT VISIT:   Next visit is scheduled on Wednesday, January 9th.    ______________________________ Zenovia Jarred, RD, LDN Clinical Nutrition Specialist BN/MEDQ  D:  09/21/2011  T:  09/21/2011  Job:  [id number]

## 2011-09-21 NOTE — Progress Notes (Signed)
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  INTERVAL SINCE LAST RADIATION:one month    ALLERGIES: Aleve   MEDICATIONS:Current outpatient prescriptions:Alum & Mag Hydroxide-Simeth (MAGIC MOUTHWASH) SOLN, Take 15 mLs by mouth every 6 (six) hours as needed.  , Disp: , Rfl: ;  amitriptyline (ELAVIL) 75 MG tablet, take 1 tablet by mouth at bedtime, Disp: 100 tablet, Rfl: 3;  DIFLUCAN 10 MG/ML suspension, Take 10 mLs (100 mg total) by mouth daily., Disp: 110 mL, Rfl: 0;  emollient (BIAFINE) cream, Apply topically as needed.  , Disp: , Rfl:  EPINEPHrine (EPIPEN JR) 0.15 MG/0.3ML injection, Inject 0.15 mg into the muscle as needed.  , Disp: , Rfl: ;  fentaNYL (DURAGESIC - DOSED MCG/HR) 50 MCG/HR, Place 1 patch onto the skin every 3 (three) days.  , Disp: , Rfl: ;  guaifenesin (ROBITUSSIN) 100 MG/5ML syrup, Swish and spit 5 mLs every 6 (six) hours as needed. Combine w/ 5ml viscous lidocaine for thick phlegm. , Disp: , Rfl:  HYDROcodone-acetaminophen (VICODIN ES) 7.5-750 MG per tablet, One half to one tablet q.4 h. P.r.n. For breakthrough migraine, Disp: 30 tablet, Rfl: 1;  Jevity 1.5 Cal (JEVITY 1.5) LIQD, 237 mLs by PEG Tube route.  , Disp: , Rfl: ;  lidocaine (XYLOCAINE) 2 % solution, Swish and spit 5 mLs every 6 (six) hours as needed. Combine w/ 5ml Robitussin as needed for thick phlegm , Disp: , Rfl:  LORazepam (ATIVAN) 0.5 MG tablet, Place 0.5 mg under the tongue every 6 (six) hours as needed.  , Disp: , Rfl: ;  metoCLOPramide (REGLAN) 5 MG/5ML solution, by Gastrostomy Tube route every 6 (six) hours as needed.  , Disp: , Rfl: ;  Nutritional Supplements (PROTEIN SUPPLEMENT 80% PO), Place 30 mLs into feeding tube 2 (two) times daily.  , Disp: , Rfl:  promethazine  (PHENERGAN) 25 MG tablet, Take 25 mg by mouth every 6 (six) hours as needed.  , Disp: , Rfl: ;  rizatriptan (MAXALT) 10 MG tablet, Take 10 mg by mouth as needed. May repeat in 2 hours if needed , Disp: , Rfl: ;  sennosides-docusate sodium (SENOKOT-S) 8.6-50 MG tablet, Take 2 tablets by mouth 2 (two) times daily as needed.  , Disp: , Rfl: ;  topiramate (TOPAMAX) 50 MG tablet, One p.o. nightly, Disp: 60 tablet, Rfl: 6 Wound Cleansers (RADIAPLEX EX), Apply topically as needed.  , Disp: , Rfl: ;  zolpidem (AMBIEN) 5 MG tablet, Take 5 mg by mouth at bedtime as needed.  , Disp: , Rfl:    NARRATIVE:  The patient returns today approximately 1 month following completion of chemoradiation in the management of his stage III squamous cell carcinoma of the right tonsil. He was recently placed on Diflucan for thrush. His nausea and vomiting are improved. He is taking in 5-6 cans of Ensure a day. He supplements his diet with applesauce and pured foods.   PHYSICAL EXAM:  height is 5' 9.3" (1.76 m) and weight is 153 lb 1.6 oz (69.446 kg). His oral temperature is 98.9 F (37.2 C). His blood pressure is 103/73 and his pulse is 85. His respiration is 18.  Nodes without palpable lymphadenopathy in the neck.. Oral cavity and oropharynx are unremarkable to inspection except for a resolving mucositis along  the oropharynx. There is no visible or palpable evidence for persistent disease along his right tonsil. Indirect mirror examination is confirmatory.    IMPRESSION: Clinically improved. I understand that [name] will see Dr. Gaylyn Rong on December 9. Dr. Gaylyn Rong has him scheduled for a PET scan on December 26.   PLAN: I instructed the patient to see Dr. Ezzard Standing for a followup visit in one month. I will see [name] back for a followup visit in 2 months. I wrote a prescription for fentanyl 25 mcg patch dispensed #5

## 2011-09-21 NOTE — Progress Notes (Signed)
BP 93/66  Pulse 85  Temp 98.9 F (37.2 C)  Jason Luna presents for periodic oral examination after radiation therapy. Patient had treatments from his 30th 2012 through 08/19/2011.   REVIEW OF CHIEF COMPLAINTS:  DRY MOUTH: Yes HARD TO SWALLOW: Yes  HURT TO SWALLOW: Yes TASTE CHANGES: Taste is starting to come back. SORES IN MOUTH: No TRISMUS: Patient without trismus symptoms. Patient is doing his exercises. WEIGHT: 154 pounds now. Patient was 185 pounds at start of treatment.  HOME OH REGIMEN:  BRUSHING: Yes-twice daily FLOSSING: Yes RINSING: Using peroxide and water. FLUORIDE: Using fluoride at bedtime in fluoride carriers. TRISMUS EXERCISES:  Maximum interincisal opening: 38 mm.   DENTAL EXAM:  Oral Hygiene:(PLAQUE): Minimal LOCATION OF MUCOSITIS: None noted DESCRIPTION OF SALIVA: Decreased. Patient with mild to moderate xerostomia. ANY EXPOSED BONE: None noted OTHER WATCHED AREAS: Teeth and primary field radiation therapy DX: 1. Xerostomia 2. Dysphagia 3. Odynopahgia 4. Dysgeusia-resolving 5. Weight loss-stable  RECOMMENDATIONS: 1. Brush after meals and at bedtime.  Use fluoride at bedtime. 2. Use trismus exercises as directed. 3. Use Biotene Rinse or salt water/baking soda rinses. 4. Multiple sips of water as needed. 5. followup with primary dentist (Dr. Teena Dunk) in 2 months for exam and cleaning and arrangement of every 4 month recalls.    Call if problems before then.  Dr. Cindra Eves  09/21/2011

## 2011-09-21 NOTE — Progress Notes (Signed)
Encounter addended by: Maryln Gottron, MD on: 09/21/2011  4:49 PM<BR>     Documentation filed: Orders

## 2011-09-21 NOTE — Progress Notes (Signed)
Taking liquids and soft food by mouth.  Still has pain in throat, 5/10 with pain med.  Coughing up brownish mucus.  Still has some pain with swallowing.  Wt loss 5lbs due to thrush , dr. Gaylyn Rong treating thrush.   Vs...103/73         85         98.9

## 2011-09-22 ENCOUNTER — Encounter (HOSPITAL_COMMUNITY): Payer: Self-pay | Admitting: Dentistry

## 2011-09-22 ENCOUNTER — Other Ambulatory Visit: Payer: Self-pay | Admitting: Radiation Oncology

## 2011-09-28 ENCOUNTER — Telehealth: Payer: Self-pay | Admitting: Oncology

## 2011-09-28 NOTE — Telephone Encounter (Signed)
Faxed office notes to [facility] attnMilderd Meager @ [phone] claim # [id number]

## 2011-10-04 ENCOUNTER — Telehealth: Payer: Self-pay | Admitting: *Deleted

## 2011-10-06 ENCOUNTER — Encounter: Payer: Self-pay | Admitting: Radiation Oncology

## 2011-10-12 ENCOUNTER — Encounter: Payer: Self-pay | Admitting: Family Medicine

## 2011-10-12 ENCOUNTER — Ambulatory Visit (INDEPENDENT_AMBULATORY_CARE_PROVIDER_SITE_OTHER): Payer: 59 | Admitting: Family Medicine

## 2011-10-12 DIAGNOSIS — J029 Acute pharyngitis, unspecified: Secondary | ICD-10-CM | POA: Insufficient documentation

## 2011-10-12 NOTE — Progress Notes (Signed)
  Subjective:    Patient ID: Jason Luna, male    DOB: 11-16-1965, 45 y.o.   MRN: [id number]  HPIGeorge is a 45 year old male, who comes in today for evaluation of a sore throat.  He developed a sore throat about two weeks ago and went to see his ENT doctor Ezzard Standing.  Dr. Ezzard Standing has been treating him for throat cancer.  He underwent surgery, radiation, and now is in the recovery phase.  Dr. Ezzard Standing gave him amoxicillin which he took 500 mg 3 times daily, but it didn't help.  A sore throat.  He has an appointment to see Dr. Ezzard Standing.  This afternoon.    Review of Systems    General an ENT review of systems otherwise negative Objective:   Physical Exam  Well-developed well-nourished man no acute distress.  Examination of the HEENT were negative.  Oral cavity shows no evidence of any erythema.  There is diffuse tenderness in the neck, but no adenopathy      Assessment & Plan:  Sore throat, unknown etiology.  Refer back to ENT to be seen today

## 2011-10-15 ENCOUNTER — Ambulatory Visit: Payer: 59 | Attending: Oncology

## 2011-10-15 DIAGNOSIS — IMO0001 Reserved for inherently not codable concepts without codable children: Secondary | ICD-10-CM | POA: Insufficient documentation

## 2011-10-15 DIAGNOSIS — C090 Malignant neoplasm of tonsillar fossa: Secondary | ICD-10-CM | POA: Insufficient documentation

## 2011-10-19 ENCOUNTER — Telehealth: Payer: Self-pay | Admitting: *Deleted

## 2011-10-19 ENCOUNTER — Encounter: Payer: Self-pay | Admitting: Radiation Oncology

## 2011-10-19 ENCOUNTER — Ambulatory Visit
Admission: RE | Admit: 2011-10-19 | Discharge: 2011-10-19 | Disposition: A | Discharge status: Home / Self Care | Payer: 59 | Source: Ambulatory Visit | Attending: Radiation Oncology | Admitting: Radiation Oncology

## 2011-10-19 VITALS — BP 107/75 | HR 80 | Temp 97.6°F | Resp 18 | Wt 154.7 lb

## 2011-10-19 DIAGNOSIS — C109 Malignant neoplasm of oropharynx, unspecified: Secondary | ICD-10-CM

## 2011-10-19 NOTE — Progress Notes (Signed)
NECK AND THROAT SEEM TO BE GETTING LARGER.  HARD TO SWALLOW, ESPECIALLY PILLS.  EATING SOFT FOODS.  SAW DR Ezzard Standing AND HE SAID THERE IS NO INFECTION THERE, TOLD HIM IT WAS SIDE EFFECT OF TX

## 2011-10-19 NOTE — Progress Notes (Signed)
Follow-up Note:  The patient is seen today approximately 2 months following completion of chemoradiation in the management of his T2 N1 squamous cell carcinoma the right tonsil metastatic to the right neck. One month ago he was almost pain-free he was recovering much faster than expected. However, a few weeks ago he noted progressive right-sided throat discomfort and neck swelling, worse in the morning. He saw Dr. Ezzard Standing last week he did not see any evidence for persistent/recurrent disease. He was given a trial of amoxicillin with no benefit. He's been taking ibuprofen and when necessary. His also been taking hydrocodone when necessary. He is on soft foods and he is forcing himself to eat. His weight is down 4 pounds over the past month.  The physical examination alert and oriented. Weight today is 154.7 pounds. Neck examination: There is a mild neck lymphedema bilaterally. Oral cavity and oropharynx are unremarkable to inspection with the exception of mild xerostomia and slight trismus. Indirect mirror examination remarkable for moderate edema of the supraglottic larynx with no evidence for recurrent disease along the right oropharynx.  Impression: I believe that the patient's symptoms are related to radiation therapy. We are still in the acute phase of recovery from radiation therapy. I recommend that he continue with ibuprofen. I renewed his Duragesic 25 mcg patches (dispense 5) I reviewed his dosimetry in detail and there is nothing out of the ordinary in terms of dose coverage which was higher on the right side and being that he had a right-sided primary and right-sided adenopathy. It is hoped that his symptoms will improve in the near future.  Plan: Followup visit with me in one month. He'll call me if there is any interval change. He may end up with chronic lymphedema of the neck. Lastly, he is scheduled for a CT PET on December 26. He'll see Dr. Ezzard Standing for a followup visit in one month as well.

## 2011-10-27 ENCOUNTER — Encounter (HOSPITAL_COMMUNITY): Payer: Self-pay

## 2011-10-27 ENCOUNTER — Encounter (HOSPITAL_COMMUNITY)
Admission: RE | Admit: 2011-10-27 | Discharge: 2011-10-27 | Disposition: A | Discharge status: Home / Self Care | Payer: 59 | Source: Ambulatory Visit | Attending: Oncology | Admitting: Oncology

## 2011-10-27 DIAGNOSIS — C109 Malignant neoplasm of oropharynx, unspecified: Secondary | ICD-10-CM | POA: Insufficient documentation

## 2011-10-27 LAB — GLUCOSE, CAPILLARY: Glucose-Capillary: 92 mg/dL (ref 70–99)

## 2011-10-27 MED ORDER — FLUDEOXYGLUCOSE F - 18 (FDG) INJECTION: 15.0000 | Freq: Once | INTRAVENOUS | Status: AC | PRN

## 2011-11-08 ENCOUNTER — Other Ambulatory Visit (HOSPITAL_BASED_OUTPATIENT_CLINIC_OR_DEPARTMENT_OTHER): Payer: 59 | Admitting: Lab

## 2011-11-08 ENCOUNTER — Other Ambulatory Visit (HOSPITAL_COMMUNITY): Payer: Self-pay

## 2011-11-08 ENCOUNTER — Ambulatory Visit: Payer: 59 | Attending: Oncology

## 2011-11-08 DIAGNOSIS — IMO0001 Reserved for inherently not codable concepts without codable children: Secondary | ICD-10-CM | POA: Insufficient documentation

## 2011-11-08 DIAGNOSIS — C109 Malignant neoplasm of oropharynx, unspecified: Secondary | ICD-10-CM

## 2011-11-08 DIAGNOSIS — C090 Malignant neoplasm of tonsillar fossa: Secondary | ICD-10-CM | POA: Insufficient documentation

## 2011-11-08 LAB — COMPREHENSIVE METABOLIC PANEL
BUN: 22 mg/dL (ref 6–23)
CO2: 30 mEq/L (ref 19–32)
Calcium: 10 mg/dL (ref 8.4–10.5)
Creatinine, Ser: 1.1 mg/dL (ref 0.50–1.35)
Glucose, Bld: 90 mg/dL (ref 70–99)
Total Bilirubin: 0.5 mg/dL (ref 0.3–1.2)

## 2011-11-08 LAB — CBC WITH DIFFERENTIAL/PLATELET
BASO%: 0.1 % (ref 0.0–2.0)
EOS%: 4.2 % (ref 0.0–7.0)
HCT: 35.1 % — ABNORMAL LOW (ref 38.4–49.9)
LYMPH%: 10 % — ABNORMAL LOW (ref 14.0–49.0)
MCH: 31.9 pg (ref 27.2–33.4)
MCHC: 34.5 g/dL (ref 32.0–36.0)
NEUT%: 73.9 % (ref 39.0–75.0)
RBC: 3.8 10*6/uL — ABNORMAL LOW (ref 4.20–5.82)
WBC: 3.9 10*3/uL — ABNORMAL LOW (ref 4.0–10.3)
lymph#: 0.4 10*3/uL — ABNORMAL LOW (ref 0.9–3.3)

## 2011-11-08 LAB — TSH: TSH: 1.102 u[IU]/mL (ref 0.350–4.500)

## 2011-11-10 ENCOUNTER — Ambulatory Visit (HOSPITAL_BASED_OUTPATIENT_CLINIC_OR_DEPARTMENT_OTHER): Payer: 59 | Admitting: Oncology

## 2011-11-10 ENCOUNTER — Ambulatory Visit: Payer: 59 | Admitting: Nutrition

## 2011-11-10 ENCOUNTER — Telehealth: Payer: Self-pay | Admitting: Oncology

## 2011-11-10 VITALS — BP 106/69 | HR 98 | Temp 98.0°F | Ht 69.0 in | Wt 150.7 lb

## 2011-11-10 DIAGNOSIS — E441 Mild protein-calorie malnutrition: Secondary | ICD-10-CM

## 2011-11-10 DIAGNOSIS — T451X5A Adverse effect of antineoplastic and immunosuppressive drugs, initial encounter: Secondary | ICD-10-CM

## 2011-11-10 DIAGNOSIS — C109 Malignant neoplasm of oropharynx, unspecified: Secondary | ICD-10-CM

## 2011-11-10 DIAGNOSIS — K1231 Oral mucositis (ulcerative) due to antineoplastic therapy: Secondary | ICD-10-CM

## 2011-11-10 MED ORDER — HYDROCODONE-ACETAMINOPHEN 5-500 MG PO TABS: 1.0000 | ORAL_TABLET | Freq: Two times a day (BID) | ORAL | Status: AC | PRN

## 2011-11-10 NOTE — Telephone Encounter (Signed)
Gave pt calendar for July, pt feeding tube will be remove on 11/11/10 10 am per In[facility] Fermo IR, pt has to be NPO after midnight, pt aware of appt

## 2011-11-10 NOTE — Progress Notes (Signed)
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  CURRENT THERAPY:  watchful observation.  INTERIM HISTORY:  Jason Luna returns to the clinic by himself.  He reports doing well. He has been working full-time. He still has some mild xerostomia; however, he is able to eat all kinds of Foods without restriction. He sometime has pain in the right jaw however he does not need frequent pain medication except for maybe once a day. He denies any palpable lymph node swelling. However he does notice on some submental edema.   Patient denies fatigue, headache, visual changes, confusion, drenching night sweats, palpable lymph node swelling, mucositis, odynophagia, dysphagia, nausea vomiting, jaundice, chest pain, palpitation, shortness of breath, dyspnea on exertion, productive cough, gum bleeding, epistaxis, hematemesis, hemoptysis, abdominal pain, abdominal swelling, early satiety, melena, hematochezia, hematuria, skin rash, spontaneous bleeding, joint swelling, joint pain, heat or cold intolerance, bowel bladder incontinence, back pain, focal motor weakness, paresthesia, depression, suicidal or homocidal ideation, feeling hopelessness.   REVIEW OF SYSTEMS:  The rest of the 14-point review of systems was negative.  CURRENT MEDICATIONS:   Current Outpatient Prescriptions  Medication Sig Dispense Refill  . amitriptyline (ELAVIL) 75 MG tablet take 1 tablet by mouth at bedtime  100 tablet  3  . EPINEPHrine (EPIPEN JR) 0.15 MG/0.3ML injection Inject 0.15 mg into the muscle as needed.        Marland Kitchen LORazepam (ATIVAN) 0.5 MG tablet Place 0.5 mg under the tongue every 6 (six) hours as needed.        . rizatriptan (MAXALT) 10 MG tablet Take 10 mg by mouth as needed. May repeat in 2 hours if needed       . sennosides-docusate sodium (SENOKOT-S) 8.6-50 MG tablet Take 2 tablets by mouth 2 (two)  times daily as needed.        . topiramate (TOPAMAX) 50 MG tablet One p.o. nightly  60 tablet  6  . zolpidem (AMBIEN) 5 MG tablet Take 5 mg by mouth at bedtime as needed.        Marland Kitchen HYDROcodone-acetaminophen (VICODIN) 5-500 MG per tablet Take 1 tablet by mouth 2 (two) times daily as needed for pain.  60 tablet  0     PHYSICAL EXAMINATION:  Vital Signs:  Temperature 97.4.  Heart rate is 90.  Respiratory rate 20.  Blood pressure 108/70.  Weight is 158.1 pounds.  ECOG performance status of 0.  General:  thin-appearing man in no acute distress.  Eyes:  no scleral icterus.  Neck: skin of the neck was dry; however, there was no active skin lesion.  I could not feel any neck adenopathy, especially on the right cervical neck.  HEENT:  Oropharynx was clear without erythema.  However, there was no clear whitish exudate or ulcerative discharge. Respiratory: lungs were clear bilaterally without wheezing or crackles.  Cardiovascular:  Regular rate and rhythm, S1/S2, without murmur, rub or gallop.  There was no pedal edema.  GI:  abdomen was soft, flat, nontender, nondistended, without organomegaly.  PEG tube was dry, clean, and intact.Muscoloskeletal:  no spinal tenderness of palpation of vertebral spine.  Skin exam was without echymosis, petichae.  Neuro exam was nonfocal.  Patient was able to get on and off exam table without assistance.  Gait was normal.  Patient was alerted and oriented.  Attention was good.   Language was appropriate.  Mood was normal without depression.  Speech was not pressured.  Thought content was not tangential.    Lab:   WBC 3.9; Hgb 12.1; Plt 198 Cr 1.1; LFT within normal limit.   IMAGING:  I personally reviewed with him the images of the PET which are complete resolution of the right basal tongue and right cervical adenopathy and uptake. There was no evidence of recurrent or metastatic disease.  ASSESSMENT AND PLAN:   1. Oropharynx squamous cell carcinoma:  I discussed with Mr. Urbanek  that there is no evidence of disease recurrence on physical exam, clinical history, but her tests, PET scan.  I advised routine followup with ENT and radiation oncology between this with me every 6 months to assure no recurrence. I advised him to avoid smoking, chewing tobacco, drink alcohol to decrease the risk of recurrence.  I arranged for him to have a followup CT neck in about 6 months the day prior to visit with me. 2. Calorie-protein malnutrition:  This is mild.  He is able to eat by mouth for the past 3 weeks without problem. I referred him to interventional she to get PEG tube removal. 3. Mucositis from chemoradiation therapy:  This is significantly improved.  He only requires Vicodin about once or twice a day. I advised him to taper this down over the next 2 months. 4. Anxiety/Depression: he is on amitriptyline with his primary care physician 5. Slightly elevated liver function tests secondary to recent chemotherapy:  This is resolved.  6. Follow up with me in clinic after surveillance CT neck in about 6 months.     ______________________________ Jason Luna, M.D. HTH/MEDQ  D:  09/03/2011  T:  09/06/2011  Job:  [id number]

## 2011-11-10 NOTE — Progress Notes (Signed)
I called Jason Luna on the phone to do a brief nutrition followup.  He had reported to nursing today at his visit with his physician that he was eating 100% of his meals.  He is no longer using his PEG for nutrition support.  He does complain of dry mouth, but he is eating a variety of foods every day.  He has no trouble with oral intake.  Weight was documented as 150 pounds today, which is continuing to decrease from his usual body weight; however, he feels good and he reports he has been increasing his activity level, which he feels is the reason why his weight has continued to decline.  NUTRITION DIAGNOSIS:  Unintended weight loss continues.  INTERVENTION:  The patient is encouraged to continue increased oral intake to maintain his weight.  He is to continue to consume adequate calories and protein to promote continued healing.   MONITORING/EVALUATION AND GOALS:  The patient has been able to transition to oral intake so that he is no longer using his feeding tube.  NEXT VISIT:  I have not scheduled a followup with Jason Luna.  He will call me if he has questions or concerns.    ______________________________ Zenovia Jarred, RD, LDN Clinical Nutrition Specialist BN/MEDQ  D:  11/10/2011  T:  11/10/2011  Job:  [id number]

## 2011-11-12 ENCOUNTER — Ambulatory Visit (HOSPITAL_COMMUNITY)
Admission: RE | Admit: 2011-11-12 | Discharge: 2011-11-12 | Disposition: A | Discharge status: Home / Self Care | Payer: 59 | Source: Ambulatory Visit | Attending: Oncology | Admitting: Oncology

## 2011-11-12 DIAGNOSIS — C109 Malignant neoplasm of oropharynx, unspecified: Secondary | ICD-10-CM

## 2011-11-12 MED ORDER — LIDOCAINE VISCOUS 2 % MT SOLN
20.0000 mL | Freq: Once | OROMUCOSAL | Status: DC
  Filled 2011-11-12: qty 20

## 2011-11-15 ENCOUNTER — Telehealth: Payer: Self-pay | Admitting: *Deleted

## 2011-11-15 ENCOUNTER — Encounter: Payer: Self-pay | Admitting: *Deleted

## 2011-11-15 NOTE — Progress Notes (Signed)
Faxed Referral to Lymphedema clinic to eval and treat Submental edema per Dr. Gaylyn Rong.  Faxed referral to [facility] fax [phone].

## 2011-11-15 NOTE — Telephone Encounter (Signed)
Pt stopped by RN desk to request his Feeding pump and the pole be picked up by [facility].  He says he called them but they require MD order.  Pt had his Feeding Tube removed on 11/12/11.   Called [facility] and s/w Nutrition team,  Gave Verbal Order to pick up the pump and pole.  They will contact pt for pick up.

## 2011-11-23 ENCOUNTER — Ambulatory Visit
Admission: RE | Admit: 2011-11-23 | Discharge: 2011-11-23 | Disposition: A | Discharge status: Home / Self Care | Payer: 59 | Source: Ambulatory Visit | Attending: Radiation Oncology | Admitting: Radiation Oncology

## 2011-11-23 ENCOUNTER — Encounter: Payer: Self-pay | Admitting: Radiation Oncology

## 2011-11-23 VITALS — BP 111/72 | HR 93 | Temp 98.3°F | Resp 18 | Wt 149.3 lb

## 2011-11-23 DIAGNOSIS — C109 Malignant neoplasm of oropharynx, unspecified: Secondary | ICD-10-CM

## 2011-11-23 NOTE — Progress Notes (Signed)
Patient presents to the clinic today unaccompanied for a follow up appointment with Dr. Dayton Scrape. Patient is alert and oriented to person, place, and time. No distress noted. Steady gait noted. Pleasant affect noted. Patient reports constant right ear pain 5 on a scale of 0-10 that is worse in the morning. Patient had his feeding tube removed the Friday before last. Patient has maintained his weight around 149 for approximately 4-5 weeks. Patient reports severe dry mouth. Patient reports shortness of breath with exertion. Edema of the throat noted. Patient reports that the lymphedema clinic contacted him while he was out of town on business. Patient plans to call the lymphedema later today. Reported all findings to Dr. Dayton Scrape.

## 2011-11-23 NOTE — Progress Notes (Signed)
Followup note:  The patient returns today approximately 3 months following completion of chemoradiation in the management of his T2 N1 squamous cell carcinoma of the right tonsil. He still having right ear discomfort, particularly with swallowing. He also has throat discomfort but this is slightly improved. He does take ibuprofen 3-4 times a day. He denies hearing loss. He had his feeding tube removed almost 2 weeks ago. He is maintaining his weight. He saw Dr. Ezzard Standing in December and we'll see him again in February. His post treatment PET scan from 10/27/2011 was without evidence for residual or recurrent disease.  Physical examination: Nodes: Without palpable lymphadenopathy in the neck. There is mild neck edema. Oral cavity and oropharynx are remarkable for mild to moderate xerostomia to inspection. No visible evidence for recurrent disease along his right tonsil. Indirect mirror examination confirmatory. No palpable evidence for recurrent disease. Ear examination reveals normal tympanic membranes and no obvious serous otitis media.  Impression: No evidence for persistent/recurrent disease. I suspect that his throat discomfort is still secondary to his radiation therapy and may persist a short while longer. I wonder if his right ear symptoms or related to edema the surrounding the right eustachian tube and suggested he may want to try popping his ears and see if his pain improves.  Plan: He'll see Dr. Ezzard Standing in February and I will have him return here for a followup visit in March. He will also maintain his followup with Dr. Gaylyn Rong.

## 2011-11-29 ENCOUNTER — Ambulatory Visit: Payer: 59 | Attending: Oncology | Admitting: Physical Therapy

## 2011-11-29 DIAGNOSIS — I890 Lymphedema, not elsewhere classified: Secondary | ICD-10-CM | POA: Insufficient documentation

## 2011-11-29 DIAGNOSIS — IMO0001 Reserved for inherently not codable concepts without codable children: Secondary | ICD-10-CM | POA: Insufficient documentation

## 2011-11-30 NOTE — Progress Notes (Signed)
Received progress notes from [facility]; forwarded to Dr. Gaylyn Rong.

## 2011-12-01 ENCOUNTER — Ambulatory Visit: Payer: 59

## 2011-12-06 ENCOUNTER — Ambulatory Visit: Payer: 59 | Attending: Oncology

## 2011-12-06 DIAGNOSIS — C090 Malignant neoplasm of tonsillar fossa: Secondary | ICD-10-CM | POA: Insufficient documentation

## 2011-12-06 DIAGNOSIS — I890 Lymphedema, not elsewhere classified: Secondary | ICD-10-CM | POA: Insufficient documentation

## 2011-12-06 DIAGNOSIS — IMO0001 Reserved for inherently not codable concepts without codable children: Secondary | ICD-10-CM | POA: Insufficient documentation

## 2011-12-08 ENCOUNTER — Ambulatory Visit: Payer: 59

## 2011-12-13 ENCOUNTER — Ambulatory Visit: Payer: 59 | Admitting: Physical Therapy

## 2011-12-15 ENCOUNTER — Telehealth: Payer: Self-pay | Admitting: *Deleted

## 2011-12-15 NOTE — Telephone Encounter (Signed)
Pt called to inform to expect call from [employer], to verify Dr. Gaylyn Rong ordered his Lymphedema therapy so he can be approved to get compression garment as recommended by OT.  His reference #[id number].  Pt states the therapy is helping and he especially notices improvement in the pain/aches he was having around his ear.   We will wait for call from [facility] and verify pt's Dx and referral for lymphedema treatment.

## 2011-12-16 ENCOUNTER — Ambulatory Visit: Payer: 59 | Admitting: Physical Therapy

## 2011-12-20 ENCOUNTER — Ambulatory Visit: Payer: 59 | Admitting: Physical Therapy

## 2011-12-29 ENCOUNTER — Ambulatory Visit: Payer: 59

## 2012-01-03 ENCOUNTER — Encounter: Payer: Self-pay | Admitting: Physical Therapy

## 2012-01-05 ENCOUNTER — Ambulatory Visit: Payer: 59 | Attending: Oncology

## 2012-01-05 DIAGNOSIS — I890 Lymphedema, not elsewhere classified: Secondary | ICD-10-CM | POA: Insufficient documentation

## 2012-01-05 DIAGNOSIS — IMO0001 Reserved for inherently not codable concepts without codable children: Secondary | ICD-10-CM | POA: Insufficient documentation

## 2012-01-06 ENCOUNTER — Other Ambulatory Visit: Payer: Self-pay | Admitting: Family Medicine

## 2012-01-18 ENCOUNTER — Encounter: Payer: Self-pay | Admitting: Radiation Oncology

## 2012-01-18 ENCOUNTER — Ambulatory Visit
Admission: RE | Admit: 2012-01-18 | Discharge: 2012-01-18 | Disposition: A | Discharge status: Home / Self Care | Payer: 59 | Source: Ambulatory Visit | Attending: Radiation Oncology | Admitting: Radiation Oncology

## 2012-01-18 VITALS — BP 99/68 | HR 92 | Temp 97.9°F | Resp 18 | Wt 152.9 lb

## 2012-01-18 DIAGNOSIS — C109 Malignant neoplasm of oropharynx, unspecified: Secondary | ICD-10-CM

## 2012-01-18 NOTE — Progress Notes (Signed)
Patient presents to the clinic today unaccompanied for a follow up appointment with Dr. Dayton Scrape. Patient is alert and oriented to person, place, and time. No distress noted. Steady gait noted. Pleasant affect noted. Patient denies pain at this time. Patient denies having any mouth sores but reports his mouth continues to be tender. Patient reports he has finished up with the lymphedema clinic but is awaiting a sleeve to wear at night. Patient reports his migraine headaches are better controlled. Patient reports dry mouth continues. Per Britta Mccreedy in Dr. Allene Pyo office patient was not seen in February. Patient scheduled for CT 05/08/2012 then to see Ha 05/10/2012 11/23/2011 weight 149.4 today weight 152.9. Patient denies nausea, vomiting, diarrhea, or dizziness. Reported all findings to Dr. Dayton Scrape. Patient reports he can make an appointment to see Dr. Ezzard Standing if need be.

## 2012-01-18 NOTE — Progress Notes (Signed)
Followup note:  Jason Luna returns today approximately 5 months following completion of chemoradiation in the management of his T2 N1 squamous cell carcinoma of the right tonsil. He is doing to well but is somewhat bothered by his neck lymphedema. He is receiving therapy through the lymphedema specialists and has a neck device to wear at night. He missed his last appointment with Dr. Ezzard Standing in February. He'll see him again in April. He is scheduled for staging CT scans in July through Dr. Gaylyn Rong. His weight remains stable. His taste is improved. His now still remains somewhat dry. His hearing is improved. He continues to work full-time.  Physical examination: Nodes: There is no palpable lymphadenopathy in the neck. There is moderate neck lymphedema. Oral cavity and oropharynx are markable for mild xerostomia. There is no visible or palpable evidence for recurrent disease along his right tonsil. Indirect mirror examination confirmatory.  Impression: Satisfactory progress with no evidence for recurrent disease.  Plan: He'll see Dr. Ezzard Standing for a followup visit in April and I will see him for a followup visit in May. He'll continue with his lymphedema therapy for his neck edema.  15 minutes was spent face-to-face with the patient, primarily counseling the patient.

## 2012-01-31 NOTE — Progress Notes (Unsigned)
Received discharge summary for PT

## 2012-03-02 ENCOUNTER — Other Ambulatory Visit: Payer: Self-pay | Admitting: Family Medicine

## 2012-03-02 NOTE — Telephone Encounter (Addendum)
Pt would like to go back to depakote for migraines. Pt was on topamax [facility]. Pt was last seen dec 12.

## 2012-03-03 MED ORDER — DIVALPROEX SODIUM 250 MG PO DR TAB: 250.0000 mg | DELAYED_RELEASE_TABLET | Freq: Every day | ORAL | Status: DC

## 2012-03-03 MED ORDER — AMITRIPTYLINE HCL 75 MG PO TABS: 75.0000 mg | ORAL_TABLET | Freq: Every day | ORAL | Status: DC

## 2012-03-03 MED ORDER — RIZATRIPTAN BENZOATE 10 MG PO TABS: 10.0000 mg | ORAL_TABLET | ORAL | Status: DC | PRN

## 2012-03-03 NOTE — Telephone Encounter (Signed)
Fleet Contras okay to go back to the depokate for his migraines,,,,,,,,, please check the dose and call any year supply

## 2012-03-20 ENCOUNTER — Ambulatory Visit: Payer: 59 | Attending: Oncology

## 2012-03-20 ENCOUNTER — Ambulatory Visit: Payer: 59

## 2012-03-20 DIAGNOSIS — IMO0001 Reserved for inherently not codable concepts without codable children: Secondary | ICD-10-CM | POA: Insufficient documentation

## 2012-03-20 DIAGNOSIS — I890 Lymphedema, not elsewhere classified: Secondary | ICD-10-CM | POA: Insufficient documentation

## 2012-03-21 ENCOUNTER — Encounter: Payer: Self-pay | Admitting: Radiation Oncology

## 2012-03-22 ENCOUNTER — Ambulatory Visit
Admission: RE | Admit: 2012-03-22 | Discharge: 2012-03-22 | Disposition: A | Discharge status: Home / Self Care | Payer: 59 | Source: Ambulatory Visit | Attending: Radiation Oncology | Admitting: Radiation Oncology

## 2012-03-22 VITALS — BP 113/73 | HR 93 | Temp 98.6°F | Resp 20 | Wt 150.4 lb

## 2012-03-22 DIAGNOSIS — R634 Abnormal weight loss: Secondary | ICD-10-CM

## 2012-03-22 DIAGNOSIS — C109 Malignant neoplasm of oropharynx, unspecified: Secondary | ICD-10-CM

## 2012-03-22 NOTE — Progress Notes (Signed)
Mr. Frutiger returns today approximately 7 months following completion of chemoradiation in the management of his T2 N1 squamous cell carcinoma the right tonsil. He is still bothered by neck edema but does wear neck device to prevent lymphedema accumulation during the night. He saw Dr. Ezzard Standing for a followup visit last week and he will see him again in 2 months. He was placed on Evoxac for his xerostomia and and this has been quite helpful. His taste remains improved. He continues to work full-time.  Physical examination: There is no palpable lymphadenopathy in the neck. There is only trace edema. Oral cavity without evidence for xerostomia (on Evoxac). Oropharynx unremarkable to inspection and palpation. No evidence for recurrent disease. Indirect mirror examination confirmatory.  Impression: Satisfactory progress without evidence for recurrent disease.  Impression: Followup visit with me in 3 months since he will be seeing Dr. Ezzard Standing in Dr. Gaylyn Rong in July.

## 2012-03-22 NOTE — Progress Notes (Signed)
Pt had dry mouth but is on Evoxac which he states is working well.  Denies pain, fatigue, loss of appetite. Does have diff w/breads, chicken. Pt is exercising jaw.

## 2012-05-05 ENCOUNTER — Other Ambulatory Visit: Payer: Self-pay | Admitting: Oncology

## 2012-05-05 DIAGNOSIS — C109 Malignant neoplasm of oropharynx, unspecified: Secondary | ICD-10-CM

## 2012-05-08 ENCOUNTER — Other Ambulatory Visit: Payer: Self-pay

## 2012-05-08 ENCOUNTER — Other Ambulatory Visit (HOSPITAL_COMMUNITY): Payer: Self-pay

## 2012-05-10 ENCOUNTER — Ambulatory Visit (HOSPITAL_BASED_OUTPATIENT_CLINIC_OR_DEPARTMENT_OTHER): Payer: 59 | Admitting: Oncology

## 2012-05-10 ENCOUNTER — Telehealth: Payer: Self-pay | Admitting: Oncology

## 2012-05-10 ENCOUNTER — Other Ambulatory Visit (HOSPITAL_BASED_OUTPATIENT_CLINIC_OR_DEPARTMENT_OTHER): Payer: Medicaid - Dental

## 2012-05-10 VITALS — BP 99/68 | HR 85 | Temp 97.6°F | Ht 69.0 in | Wt 147.7 lb

## 2012-05-10 DIAGNOSIS — C109 Malignant neoplasm of oropharynx, unspecified: Secondary | ICD-10-CM

## 2012-05-10 DIAGNOSIS — C090 Malignant neoplasm of tonsillar fossa: Secondary | ICD-10-CM

## 2012-05-10 LAB — CBC WITH DIFFERENTIAL/PLATELET
Basophils Absolute: 0 10*3/uL (ref 0.0–0.1)
Eosinophils Absolute: 0.1 10*3/uL (ref 0.0–0.5)
HGB: 12.7 g/dL — ABNORMAL LOW (ref 13.0–17.1)
NEUT#: 3.2 10*3/uL (ref 1.5–6.5)
RDW: 13 % (ref 11.0–14.6)
lymph#: 0.4 10*3/uL — ABNORMAL LOW (ref 0.9–3.3)

## 2012-05-10 LAB — CMP (CANCER CENTER ONLY)
Albumin: 3.8 g/dL (ref 3.3–5.5)
CO2: 34 mEq/L — ABNORMAL HIGH (ref 18–33)
Glucose, Bld: 99 mg/dL (ref 73–118)
Potassium: 4.2 mEq/L (ref 3.3–4.7)
Sodium: 140 mEq/L (ref 128–145)
Total Protein: 7.1 g/dL (ref 6.4–8.1)

## 2012-05-10 NOTE — Progress Notes (Signed)
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   CURRENT THERAPY: watchful observation.  INTERVAL HISTORY: Jason Luna 46 y.o. male returns for regular follow up by himself.  He reports feeling well.  He still has some xerostomia.  However, he was prescribed cevimeline (Evoxac) by his ENT physician with significant improvement of his xerostomia.  His stamina has also improved.  He has been able to work full time in addition to lots of household chores.  His father recently passed away from metastatic pancreatic cancer; and patient himself has been active in his late father's household upkeep.  Jason Luna still has some neck swelling, and he still uses the compression garment at night with only residual neck edema.  He does not smoke/chew tobacco, or drink EtOH.  He does not notice any discrete neck swelling.  He denied dysphagia, odynophagia, aspiration.  Patient denies fever, anorexia, weight loss, fatigue, headache, visual changes, confusion, drenching night sweats, palpable lymph node swelling, mucositis, odynophagia, dysphagia, nausea vomiting, jaundice, chest pain, palpitation, shortness of breath, dyspnea on exertion, productive cough, gum bleeding, epistaxis, hematemesis, hemoptysis, abdominal pain, abdominal swelling, early satiety, melena, hematochezia, hematuria, skin rash, spontaneous bleeding, joint swelling, joint pain, heat or cold intolerance, bowel bladder incontinence, back pain, focal motor weakness, paresthesia, depression, suicidal or homocidal ideation, feeling hopelessness.   Past Medical History  Diagnosis Date  . Allergy   . Headache   . Gastritis   . 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  Weight loss, abnormal 2012    PEG tube placement Oct 2012  . 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 History of radiation therapy 07/01/11 -08/19/11    right tonsil  . Migraines   . Nasal septal perforation     hx of chronic  . Anxiety and depression     Past Surgical History  Procedure Date  . Nasal sinus surgery   . Peg tube placement 08/09/11 at [facility]    Current Outpatient Prescriptions  Medication Sig Dispense Refill  . amitriptyline (ELAVIL) 75 MG tablet Take 1 tablet (75 mg total) by mouth at bedtime.  100 tablet  3  . cevimeline (EVOXAC) 30 MG capsule Take 30 mg by mouth 3 (three) times daily.      . divalproex (DEPAKOTE) 250 MG DR tablet Take 1 tablet (250 mg total) by mouth daily.  100 tablet  3  . EPINEPHrine (EPIPEN JR) 0.15 MG/0.3ML injection Inject 0.15 mg into the muscle as needed.        . rizatriptan (MAXALT) 10 MG tablet Take 1 tablet (10 mg total) by mouth as needed for migraine. May repeat in 2 hours if needed  6 tablet  1    ALLERGIES:  is allergic to naproxen sodium and bee venom.  REVIEW OF SYSTEMS:  The rest of the 14-point review of system was negative.   Filed Vitals:   05/10/12 1052  BP: 99/68  Pulse: 85  Temp: 97.6 F (36.4 C)   Wt Readings from Last 3 Encounters:  05/10/12 147 lb 11.2 oz (66.996 kg)  03/22/12 150 lb 6.4 oz (68.221 kg)  01/18/12 152 lb 14.4 oz (69.355 kg)   ECOG Performance status: 0  PHYSICAL EXAMINATION:  General:  well-nourished man, in no acute distress.  Eyes:  no scleral icterus.  ENT:  There were no oropharyngeal lesions on my unaided exam.  Neck was without thyromegaly.  Lymphatics:  Negative cervical, supraclavicular or axillary adenopathy.  Respiratory: lungs were clear bilaterally without wheezing or crackles.  Cardiovascular:  Regular rate and rhythm, S1/S2, without murmur, rub or gallop.  There was no pedal edema.  GI:  abdomen was soft, flat, nontender, nondistended, without organomegaly.   Muscoloskeletal:  no spinal tenderness of palpation of vertebral spine.  Skin exam was without echymosis, petichae.  Neuro exam was nonfocal.  Patient was able to get on and off exam table without assistance.  Gait was normal.  Patient was alerted and oriented.  Attention was good.   Language was appropriate.  Mood was normal without depression.  Speech was not pressured.  Thought content was not tangential.      LABORATORY/RADIOLOGY DATA:  Lab Results  Component Value Date   WBC 4.2 05/10/2012   HGB 12.7* 05/10/2012   HCT 37.9* 05/10/2012   PLT 164 05/10/2012   GLUCOSE 99 05/10/2012   CHOL 169 08/11/2010   TRIG 209.0* 08/11/2010   HDL 31.90* 08/11/2010   LDLDIRECT 103.0 08/11/2010   ALKPHOS 98* 05/10/2012   ALT 15 11/08/2011   AST 30 05/10/2012   NA 140 05/10/2012   K 4.2 05/10/2012   CL 98 05/10/2012   CREATININE 1.2 05/10/2012   BUN 20 05/10/2012   CO2 34* 05/10/2012   INR 0.90 08/09/2011     ASSESSMENT AND PLAN:    1. Oropharynx squamous cell carcinoma:  There was no evidence of local or metastatic disease on today history, exam, and lab.  As he has no focal symptoms, his CT neck was declined by his insurance.  I advised him to follow up with his ENT service; and if he develops concerning local symptoms, then a restaging CT will be requested again.  2. Calorie-protein malnutrition: resolved.  His appetite has completely recovered.  3. 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  4. Anxiety/Depression: he is on amitriptyline and Depakote with his primary care physician 5. Follow up:  In about 6 months.  6. Age appropriate cancer screening:  He inquired about his father's history of pancreas cancer.  Reviewing his family history, there was low clinical suspicion for p53 mutation.  There is no indication for genetic counseling referral.  I advised him to obtain a routine screening colonoscopy at age 56. 7. Mild normocytic anemia:  Most likely slow  bone marrow recovery from past chemo.  His anemia is improved from prior.  I may consider further work up in the future if his anemia significantly worsens.    The length of time of the face-to-face encounter was . More than 50% of time was spent counseling and coordination of care.

## 2012-05-10 NOTE — Telephone Encounter (Signed)
appts made and printed for pt aom °

## 2012-06-15 DIAGNOSIS — Z923 Personal history of irradiation: Secondary | ICD-10-CM | POA: Insufficient documentation

## 2012-06-21 ENCOUNTER — Ambulatory Visit: Payer: 59 | Admitting: Radiation Oncology

## 2012-07-04 ENCOUNTER — Telehealth: Payer: Self-pay | Admitting: Family Medicine

## 2012-07-04 NOTE — Telephone Encounter (Signed)
Opened in error

## 2012-07-04 NOTE — Telephone Encounter (Signed)
Caller: Jason Luna/Patient; PCP: Kelle Darting (Family Practice); CB#: [phone]; Call regarding Fever and abdominal pain. Onset 07/04/12 with RUQ this am and now the pain is located RLQ.  Fever 102 @ 1300 09/03.  Pt took Advil for fever.  Pt states pain feels better when lying down. Pain increases with walking and jarring.  Pt had to come home from work due to the pain. Urgent symptoms positive per 'Generalized abdominal pain that progesses to localized pain and any of the following loss of appetite, vomiting after pain, any fever, or unable to carry out normal activities using Abdominal Pain protocol. Instructed pt to go to the ED.  Pt declined advice and wants to schedule an appt 07/05/12. System down at this time did tell pt I will call him back. Follow up 07/04/12 @ 1810  Instructed pt to callback 07/05/12 to schedule an appt and no triage is needed.  Pt verbalized understanding.

## 2012-07-05 ENCOUNTER — Encounter (HOSPITAL_COMMUNITY): Payer: Self-pay | Admitting: Emergency Medicine

## 2012-07-05 ENCOUNTER — Ambulatory Visit (INDEPENDENT_AMBULATORY_CARE_PROVIDER_SITE_OTHER): Payer: 59 | Admitting: Family Medicine

## 2012-07-05 ENCOUNTER — Encounter (HOSPITAL_COMMUNITY): Payer: Self-pay | Admitting: Anesthesiology

## 2012-07-05 ENCOUNTER — Encounter (HOSPITAL_COMMUNITY): Admission: EM | Disposition: A | Discharge status: Home / Self Care | Payer: Self-pay | Source: Home / Self Care

## 2012-07-05 ENCOUNTER — Ambulatory Visit (HOSPITAL_COMMUNITY)
Admission: EM | Admit: 2012-07-05 | Discharge: 2012-07-06 | Disposition: A | Discharge status: Home / Self Care | Payer: 59 | Attending: General Surgery | Admitting: General Surgery

## 2012-07-05 ENCOUNTER — Inpatient Hospital Stay (HOSPITAL_COMMUNITY): Payer: 59 | Admitting: Anesthesiology

## 2012-07-05 ENCOUNTER — Emergency Department (HOSPITAL_COMMUNITY): Payer: 59

## 2012-07-05 ENCOUNTER — Encounter: Payer: Self-pay | Admitting: Family Medicine

## 2012-07-05 VITALS — BP 110/70 | Temp 98.7°F | Wt 152.0 lb

## 2012-07-05 DIAGNOSIS — Z9221 Personal history of antineoplastic chemotherapy: Secondary | ICD-10-CM | POA: Insufficient documentation

## 2012-07-05 DIAGNOSIS — Z923 Personal history of irradiation: Secondary | ICD-10-CM | POA: Insufficient documentation

## 2012-07-05 DIAGNOSIS — K3580 Unspecified acute appendicitis: Secondary | ICD-10-CM

## 2012-07-05 DIAGNOSIS — G43909 Migraine, unspecified, not intractable, without status migrainosus: Secondary | ICD-10-CM | POA: Insufficient documentation

## 2012-07-05 DIAGNOSIS — Z79899 Other long term (current) drug therapy: Secondary | ICD-10-CM | POA: Insufficient documentation

## 2012-07-05 DIAGNOSIS — Z85819 Personal history of malignant neoplasm of unspecified site of lip, oral cavity, and pharynx: Secondary | ICD-10-CM | POA: Diagnosis present

## 2012-07-05 DIAGNOSIS — R634 Abnormal weight loss: Secondary | ICD-10-CM | POA: Insufficient documentation

## 2012-07-05 DIAGNOSIS — R1031 Right lower quadrant pain: Secondary | ICD-10-CM

## 2012-07-05 DIAGNOSIS — R52 Pain, unspecified: Secondary | ICD-10-CM

## 2012-07-05 DIAGNOSIS — Z8669 Personal history of other diseases of the nervous system and sense organs: Secondary | ICD-10-CM

## 2012-07-05 HISTORY — DX: Unspecified acute appendicitis: K35.80

## 2012-07-05 HISTORY — DX: Personal history of other diseases of the nervous system and sense organs: Z86.69

## 2012-07-05 HISTORY — DX: Personal history of malignant neoplasm of unspecified site of lip, oral cavity, and pharynx: Z85.819

## 2012-07-05 HISTORY — PX: LAPAROSCOPIC APPENDECTOMY: SHX408

## 2012-07-05 LAB — CBC WITH DIFFERENTIAL/PLATELET
Basophils Absolute: 0 10*3/uL (ref 0.0–0.1)
Basophils Relative: 0 % (ref 0–1)
HCT: 38.5 % — ABNORMAL LOW (ref 39.0–52.0)
Lymphocytes Relative: 4 % — ABNORMAL LOW (ref 12–46)
MCHC: 34.3 g/dL (ref 30.0–36.0)
Monocytes Absolute: 0.8 10*3/uL (ref 0.1–1.0)
Neutro Abs: 5.9 10*3/uL (ref 1.7–7.7)
Neutrophils Relative %: 84 % — ABNORMAL HIGH (ref 43–77)
Platelets: 166 10*3/uL (ref 150–400)
RDW: 12.5 % (ref 11.5–15.5)
WBC: 7.1 10*3/uL (ref 4.0–10.5)

## 2012-07-05 LAB — URINALYSIS, ROUTINE W REFLEX MICROSCOPIC
Glucose, UA: NEGATIVE mg/dL
Hgb urine dipstick: NEGATIVE
Ketones, ur: NEGATIVE mg/dL
Leukocytes, UA: NEGATIVE
pH: 6 (ref 5.0–8.0)

## 2012-07-05 LAB — COMPREHENSIVE METABOLIC PANEL
ALT: 23 U/L (ref 0–53)
AST: 28 U/L (ref 0–37)
Albumin: 3.9 g/dL (ref 3.5–5.2)
CO2: 31 mEq/L (ref 19–32)
Chloride: 95 mEq/L — ABNORMAL LOW (ref 96–112)
Creatinine, Ser: 1.05 mg/dL (ref 0.50–1.35)
Sodium: 134 mEq/L — ABNORMAL LOW (ref 135–145)
Total Bilirubin: 0.6 mg/dL (ref 0.3–1.2)

## 2012-07-05 SURGERY — APPENDECTOMY, LAPAROSCOPIC
Anesthesia: General | Site: Abdomen | Wound class: Contaminated

## 2012-07-05 MED ORDER — ONDANSETRON HCL 4 MG/2ML IJ SOLN
4.0000 mg | Freq: Four times a day (QID) | INTRAMUSCULAR | Status: DC | PRN
  Administered 2012-07-05: 4 mg via INTRAVENOUS
  Filled 2012-07-05: qty 2

## 2012-07-05 MED ORDER — MORPHINE SULFATE 2 MG/ML IJ SOLN
1.0000 mg | INTRAMUSCULAR | Status: DC | PRN
  Administered 2012-07-05: 2 mg via INTRAVENOUS
  Filled 2012-07-05: qty 1

## 2012-07-05 MED ORDER — CEVIMELINE HCL 30 MG PO CAPS
30.0000 mg | ORAL_CAPSULE | Freq: Three times a day (TID) | ORAL | Status: DC
Start: 2012-07-06 — End: 2012-07-06
  Administered 2012-07-06: 30 mg via ORAL

## 2012-07-05 MED ORDER — SODIUM CHLORIDE 0.9 % IV BOLUS (SEPSIS)
1000.0000 mL | Freq: Once | INTRAVENOUS | Status: AC
  Administered 2012-07-05: 1000 mL via INTRAVENOUS

## 2012-07-05 MED ORDER — DIPHENHYDRAMINE HCL 12.5 MG/5ML PO ELIX: 12.5000 mg | ORAL_SOLUTION | Freq: Four times a day (QID) | ORAL | Status: DC | PRN

## 2012-07-05 MED ORDER — DIVALPROEX SODIUM 250 MG PO DR TAB
250.0000 mg | DELAYED_RELEASE_TABLET | Freq: Every day | ORAL | Status: DC
  Filled 2012-07-05: qty 1

## 2012-07-05 MED ORDER — LACTATED RINGERS IV SOLN: INTRAVENOUS | Status: DC

## 2012-07-05 MED ORDER — SUMATRIPTAN SUCCINATE 50 MG PO TABS
50.0000 mg | ORAL_TABLET | Freq: Once | ORAL | Status: DC
  Filled 2012-07-05: qty 1

## 2012-07-05 MED ORDER — ROCURONIUM BROMIDE 100 MG/10ML IV SOLN
INTRAVENOUS | Status: DC | PRN
  Administered 2012-07-05: 5 mg via INTRAVENOUS
  Administered 2012-07-05: 15 mg via INTRAVENOUS

## 2012-07-05 MED ORDER — PROPOFOL 10 MG/ML IV BOLUS
INTRAVENOUS | Status: DC | PRN
  Administered 2012-07-05: 180 mg via INTRAVENOUS

## 2012-07-05 MED ORDER — HYDROMORPHONE HCL PF 1 MG/ML IJ SOLN: 0.2500 mg | INTRAMUSCULAR | Status: DC | PRN

## 2012-07-05 MED ORDER — LACTATED RINGERS IV SOLN
INTRAVENOUS | Status: DC | PRN
  Administered 2012-07-05 (×2): via INTRAVENOUS

## 2012-07-05 MED ORDER — GLYCOPYRROLATE 0.2 MG/ML IJ SOLN
INTRAMUSCULAR | Status: DC | PRN
  Administered 2012-07-05: 0.4 mg via INTRAVENOUS

## 2012-07-05 MED ORDER — SUCCINYLCHOLINE CHLORIDE 20 MG/ML IJ SOLN
INTRAMUSCULAR | Status: DC | PRN
  Administered 2012-07-05: 100 mg via INTRAVENOUS

## 2012-07-05 MED ORDER — ACETAMINOPHEN 325 MG PO TABS: 650.0000 mg | ORAL_TABLET | Freq: Four times a day (QID) | ORAL | Status: DC | PRN

## 2012-07-05 MED ORDER — ACETAMINOPHEN 10 MG/ML IV SOLN
INTRAVENOUS | Status: AC
  Filled 2012-07-05: qty 100

## 2012-07-05 MED ORDER — ACETAMINOPHEN 10 MG/ML IV SOLN
INTRAVENOUS | Status: DC | PRN
  Administered 2012-07-05: 1000 mg via INTRAVENOUS

## 2012-07-05 MED ORDER — FENTANYL CITRATE 0.05 MG/ML IJ SOLN
INTRAMUSCULAR | Status: DC | PRN
  Administered 2012-07-05: 50 ug via INTRAVENOUS
  Administered 2012-07-05: 100 ug via INTRAVENOUS
  Administered 2012-07-05 (×2): 50 ug via INTRAVENOUS

## 2012-07-05 MED ORDER — LIDOCAINE HCL (CARDIAC) 20 MG/ML IV SOLN
INTRAVENOUS | Status: DC | PRN
  Administered 2012-07-05: 80 mg via INTRAVENOUS

## 2012-07-05 MED ORDER — ACETAMINOPHEN 650 MG RE SUPP: 650.0000 mg | Freq: Four times a day (QID) | RECTAL | Status: DC | PRN

## 2012-07-05 MED ORDER — BUPIVACAINE-EPINEPHRINE PF 0.25-1:200000 % IJ SOLN
INTRAMUSCULAR | Status: AC
  Filled 2012-07-05: qty 30

## 2012-07-05 MED ORDER — ENOXAPARIN SODIUM 40 MG/0.4ML ~~LOC~~ SOLN
40.0000 mg | SUBCUTANEOUS | Status: DC
  Administered 2012-07-06: 40 mg via SUBCUTANEOUS
  Filled 2012-07-05 (×2): qty 0.4

## 2012-07-05 MED ORDER — DEXAMETHASONE SODIUM PHOSPHATE 10 MG/ML IJ SOLN
INTRAMUSCULAR | Status: DC | PRN
  Administered 2012-07-05: 10 mg via INTRAVENOUS

## 2012-07-05 MED ORDER — MIDAZOLAM HCL 5 MG/5ML IJ SOLN
INTRAMUSCULAR | Status: DC | PRN
  Administered 2012-07-05 (×2): 2 mg via INTRAVENOUS

## 2012-07-05 MED ORDER — PIPERACILLIN-TAZOBACTAM 3.375 G IVPB
3.3750 g | Freq: Three times a day (TID) | INTRAVENOUS | Status: DC
  Administered 2012-07-05 – 2012-07-06 (×3): 3.375 g via INTRAVENOUS
  Filled 2012-07-05 (×4): qty 50

## 2012-07-05 MED ORDER — AMITRIPTYLINE HCL 75 MG PO TABS
75.0000 mg | ORAL_TABLET | Freq: Every day | ORAL | Status: DC
  Administered 2012-07-05: 75 mg via ORAL
  Filled 2012-07-05 (×2): qty 1

## 2012-07-05 MED ORDER — DIVALPROEX SODIUM 250 MG PO DR TAB: 250.0000 mg | DELAYED_RELEASE_TABLET | Freq: Every day | ORAL | Status: DC

## 2012-07-05 MED ORDER — NEOSTIGMINE METHYLSULFATE 1 MG/ML IJ SOLN
INTRAMUSCULAR | Status: DC | PRN
  Administered 2012-07-05: 3 mg via INTRAVENOUS

## 2012-07-05 MED ORDER — KCL IN DEXTROSE-NACL 20-5-0.45 MEQ/L-%-% IV SOLN
INTRAVENOUS | Status: DC
  Administered 2012-07-05 – 2012-07-06 (×2): via INTRAVENOUS
  Filled 2012-07-05 (×4): qty 1000

## 2012-07-05 MED ORDER — LACTATED RINGERS IV SOLN
INTRAVENOUS | Status: DC | PRN
  Administered 2012-07-05: 1000 mL via INTRAVENOUS

## 2012-07-05 MED ORDER — OXYCODONE-ACETAMINOPHEN 5-325 MG PO TABS: 1.0000 | ORAL_TABLET | ORAL | Status: DC | PRN

## 2012-07-05 MED ORDER — IOHEXOL 300 MG/ML  SOLN
100.0000 mL | Freq: Once | INTRAMUSCULAR | Status: AC | PRN
  Administered 2012-07-05: 100 mL via INTRAVENOUS

## 2012-07-05 MED ORDER — DIPHENHYDRAMINE HCL 50 MG/ML IJ SOLN: 12.5000 mg | Freq: Four times a day (QID) | INTRAMUSCULAR | Status: DC | PRN

## 2012-07-05 MED ORDER — BUPIVACAINE-EPINEPHRINE 0.25% -1:200000 IJ SOLN
INTRAMUSCULAR | Status: DC | PRN
  Administered 2012-07-05: 10 mL

## 2012-07-05 SURGICAL SUPPLY — 36 items
APPLIER CLIP ROT 10 11.4 M/L (STAPLE)
CANISTER SUCTION 2500CC (MISCELLANEOUS) ×2 IMPLANT
CLIP APPLIE ROT 10 11.4 M/L (STAPLE) IMPLANT
CLOTH BEACON ORANGE TIMEOUT ST (SAFETY) ×2 IMPLANT
CUTTER FLEX LINEAR 45M (STAPLE) ×2 IMPLANT
DECANTER SPIKE VIAL GLASS SM (MISCELLANEOUS) ×2 IMPLANT
DERMABOND ADVANCED (GAUZE/BANDAGES/DRESSINGS) ×1
DERMABOND ADVANCED .7 DNX12 (GAUZE/BANDAGES/DRESSINGS) ×1 IMPLANT
DRAPE LAPAROSCOPIC ABDOMINAL (DRAPES) ×2 IMPLANT
DRAPE WARM FLUID 44X44 (DRAPE) ×2 IMPLANT
ELECT REM PT RETURN 9FT ADLT (ELECTROSURGICAL) ×2
ELECTRODE REM PT RTRN 9FT ADLT (ELECTROSURGICAL) ×1 IMPLANT
ENDOLOOP SUT PDS II  0 18 (SUTURE)
ENDOLOOP SUT PDS II 0 18 (SUTURE) IMPLANT
GLOVE BIOGEL PI IND STRL 7.0 (GLOVE) ×1 IMPLANT
GLOVE BIOGEL PI INDICATOR 7.0 (GLOVE) ×1
GLOVE INDICATOR 8.0 STRL GRN (GLOVE) ×4 IMPLANT
GLOVE SS BIOGEL STRL SZ 8 (GLOVE) ×1 IMPLANT
GLOVE SUPERSENSE BIOGEL SZ 8 (GLOVE) ×1
GOWN STRL NON-REIN LRG LVL3 (GOWN DISPOSABLE) ×2 IMPLANT
GOWN STRL REIN XL XLG (GOWN DISPOSABLE) ×4 IMPLANT
HAND ACTIVATED (MISCELLANEOUS) IMPLANT
KIT BASIN OR (CUSTOM PROCEDURE TRAY) ×2 IMPLANT
PENCIL BUTTON HOLSTER BLD 10FT (ELECTRODE) ×2 IMPLANT
POUCH SPECIMEN RETRIEVAL 10MM (ENDOMECHANICALS) ×2 IMPLANT
RELOAD 45 VASCULAR/THIN (ENDOMECHANICALS) IMPLANT
RELOAD STAPLE TA45 3.5 REG BLU (ENDOMECHANICALS) ×2 IMPLANT
SET IRRIG TUBING LAPAROSCOPIC (IRRIGATION / IRRIGATOR) ×2 IMPLANT
SUT MNCRL AB 4-0 PS2 18 (SUTURE) ×2 IMPLANT
TOWEL OR 17X26 10 PK STRL BLUE (TOWEL DISPOSABLE) ×2 IMPLANT
TRAY FOLEY CATH 14FRSI W/METER (CATHETERS) ×2 IMPLANT
TRAY LAP CHOLE (CUSTOM PROCEDURE TRAY) ×2 IMPLANT
TROCAR BLADELESS OPT 5 75 (ENDOMECHANICALS) ×4 IMPLANT
TROCAR XCEL 12X100 BLDLESS (ENDOMECHANICALS) ×2 IMPLANT
TROCAR XCEL BLUNT TIP 100MML (ENDOMECHANICALS) ×2 IMPLANT
TUBING INSUFFLATION 10FT LAP (TUBING) ×2 IMPLANT

## 2012-07-05 NOTE — Telephone Encounter (Signed)
Spoke with patient and an appointment made 

## 2012-07-05 NOTE — Op Note (Signed)
Appendectomy, Lap, Procedure Note  Indications: The patient presented with a history of right-sided abdominal pain. A CT  revealed findings consistent with acute appendicitis.  Pre-operative Diagnosis: Appendicitis, unqualified  Post-operative Diagnosis: Same  Surgeon: Langdon Crosson A.   Assistants: or staff  Anesthesia: General endotracheal anesthesia and Local anesthesia 0.25.% bupivacaine, with epinephrine  ASA Class: 2  Procedure Details  The patient was seen again in the Holding Room. The risks, benefits, complications, treatment options, and expected outcomes were discussed with the patient and/or family. The possibilities of reaction to medication, pulmonary aspiration, perforation of viscus, bleeding, recurrent infection, finding a normal appendix, the need for additional procedures, failure to diagnose a condition, and creating a complication requiring transfusion or operation were discussed. There was concurrence with the proposed plan and informed consent was obtained. The site of surgery was properly noted/marked. The patient was taken to Operating Room, identified as Jason Luna and the procedure verified as Appendectomy. A Time Out was held and the above information confirmed.  The patient was placed in the supine position and general anesthesia was induced, along with placement of orogastric tube, Venodyne boots, and a Foley catheter. The abdomen was prepped and draped in a sterile fashion. A one centimeter supraumbilical incision was made and the peritoneal cavity was accessed using the OPEN  technique. The pneumoperitoneum was then established to steady pressure of 15 mmHg. A 12 mm port was placed through the umbilical incision. Additional 5 mm cannulas then placed in the left lower quadrant of the abdomen and half way between the umbilicus and pubic symphysis under direct vision. A careful evaluation of the entire abdomen was carried out. The patient was placed in  Trendelenburg and left lateral decubitus position. The small intestines were retracted in the cephalad and left lateral direction away from the pelvis and right lower quadrant. The patient was found to have an enlarged and inflamed appendix that was extending into the pelvis. There was no evidence of perforation.  The appendix was carefully dissected. A window was made in the mesoappendix at the base of the appendix. A harmonic scalpel was used across the mesoappendix. The appendix was divided at its base using an 45  endo-GIA stapler. Minimal appendiceal stump was left in place. There was no evidence of bleeding, leakage, or complication after division of the appendix. Irrigation was also performed and irrigate suctioned from the abdomen as well.  The umbilical port site was closed using 0 vicryl pursestring sutures fashion at the level of the fascia. The trocar site skin wounds were closed using 4 O monocryl.  Instrument, sponge, and needle counts were correct at the conclusion of the case.   Findings: The appendix was found to be inflamed. There were not signs of necrosis.  There was not perforation. There was not abscess formation.  Estimated Blood Loss:  less than 50 mL         Drains: none         Total IV Fluids: 1200 mL         Specimens: apendix         Complications:  None; patient tolerated the procedure well.         Disposition: PACU - hemodynamically stable.         Condition: stable

## 2012-07-05 NOTE — ED Notes (Signed)
[id number]<AV> Expected date:<BR> Expected time:<BR> Means of arrival:<BR> Comments:<BR> Pt Dr. Tawanna Cooler ? appendicitis

## 2012-07-05 NOTE — Telephone Encounter (Signed)
Tried to call patient no answer. 

## 2012-07-05 NOTE — Transfer of Care (Signed)
Immediate Anesthesia Transfer of Care Note  Patient: Jason Luna  Procedure(s) Performed: Procedure(s) (LRB) with comments: APPENDECTOMY LAPAROSCOPIC (N/A)  Patient Location: PACU  Anesthesia Type: General  Level of Consciousness: awake, alert  and oriented  Airway & Oxygen Therapy: Patient Spontanous Breathing and Patient connected to face mask oxygen  Post-op Assessment: Report given to PACU RN and Post -op Vital signs reviewed and stable  Post vital signs: Reviewed and stable  Complications: No apparent anesthesia complications

## 2012-07-05 NOTE — Patient Instructions (Signed)
Go directly to [facility] long emergency room for evaluation

## 2012-07-05 NOTE — ED Notes (Signed)
Pt transported to surgery

## 2012-07-05 NOTE — Anesthesia Postprocedure Evaluation (Signed)
  Anesthesia Post-op Note  Patient: Jason Luna  Procedure(s) Performed: Procedure(s) (LRB): APPENDECTOMY LAPAROSCOPIC (N/A)  Patient Location: PACU  Anesthesia Type: General  Level of Consciousness: awake and alert   Airway and Oxygen Therapy: Patient Spontanous Breathing  Post-op Pain: mild  Post-op Assessment: Post-op Vital signs reviewed, Patient's Cardiovascular Status Stable, Respiratory Function Stable, Patent Airway and No signs of Nausea or vomiting  Post-op Vital Signs: stable  Complications: No apparent anesthesia complications

## 2012-07-05 NOTE — ED Provider Notes (Signed)
Jason Luna is a 46 y.o. male being evaluated for abdominal pain, with CAT scan. He has had intermittent abdominal pain, and lethargy for 3 weeks. In the last 3, days, he's had fever, and persistent pain. Labs are unremarkable. CT is consistent with acute appendicitis without perforation or abscess.  General surgery contacted to dilate the patient in emergency department for elective appendectomy.  Flint Melter, MD 07/05/12 [phone]

## 2012-07-05 NOTE — Anesthesia Preprocedure Evaluation (Addendum)
Anesthesia Evaluation  Patient identified by MRN, date of birth, ID band Patient awake    Reviewed: Allergy & Precautions, H&P , NPO status , Patient's Chart, lab work & pertinent test results  Airway Mallampati: III TM Distance: >3 FB Neck ROM: full    Dental No notable dental hx. (+) Teeth Intact and Dental Advisory Given   Pulmonary neg pulmonary ROS,  breath sounds clear to auscultation  Pulmonary exam normal       Cardiovascular Exercise Tolerance: Good negative cardio ROS  Rhythm:regular Rate:Normal     Neuro/Psych negative neurological ROS  negative psych ROS   GI/Hepatic negative GI ROS, Neg liver ROS, GERD-  ,  Endo/Other  negative endocrine ROS  Renal/GU negative Renal ROS  negative genitourinary   Musculoskeletal   Abdominal   Peds  Hematology negative hematology ROS (+)   Anesthesia Other Findings Tonsillar cancer with radiation  Reproductive/Obstetrics negative OB ROS                          Anesthesia Physical Anesthesia Plan  ASA: III  Anesthesia Plan: General   Post-op Pain Management:    Induction: Intravenous  Airway Management Planned: Oral ETT  Additional Equipment:   Intra-op Plan:   Post-operative Plan: Extubation in OR  Informed Consent: I have reviewed the patients History and Physical, chart, labs and discussed the procedure including the risks, benefits and alternatives for the proposed anesthesia with the patient or authorized representative who has indicated his/her understanding and acceptance.   Dental advisory given  Plan Discussed with: Surgeon  Anesthesia Plan Comments:         Anesthesia Quick Evaluation

## 2012-07-05 NOTE — H&P (Signed)
Jason Luna is an 46 y.o. male.   Primary Care:  Dr. Governor Specking ENT:  Narda Bonds MD Oncology: Dr. Gaylyn Rong Chief Complaint: Abdominal pain HPI: Patient is a 46 year old male who presents with abdominal pain which started in the evening of 07/03/12. He was fine when he went to bed, but woke up with a temperature of 102 and pain on the right side. This improved but the pain would come and go throughout the day on Tuesday 07/04/12. He was able to eat dinner last night, his appetite very diminished. He still has pain this a.m. he did eat some cereal this morning. He's had one episode of nausea today and presented to the ER for evaluation. He was afebrile on admission, vital signs are stable.  CT scan shows the appendix is distended with increased enhancement and infiltration around the surrounding fat consistent with acute appendicitis there is no abscess or perforation noted. White count 7100.  Patient continues to have pain in his right lower quadrant, especially with ambulation.  Past Medical History  Diagnosis Date  . Allergy   . Headache   . Gastritis   . Oropharynx cancer with radiation and chemotherapy  Last radiation Rx: Oct 2012    HPV positive  . Weight loss, abnormal 2012    PEG tube placement Oct 2012  . 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 History of radiation therapy 07/01/11 -08/19/11    right tonsil  . Migraines   . Nasal septal perforation     hx of chronic   Dyshidrosis     Past Surgical History  Procedure Date  . Nasal sinus surgery   . Peg tube placement 08/09/11 at [facility]    Family History  Problem Relation Age of Onset  . GI problems Mother     crohns  . Asthma Other   . Cancer Other     prostate,skin  . Leukemia Mother     ALL age 43  . Cancer Father     pancreatic   Social History:  reports that he has never smoked. He does not have any smokeless tobacco history on file. He reports that he drinks alcohol. He reports that he uses illicit  drugs.  Allergies:  Allergies  Allergen Reactions  . Naproxen Sodium Anaphylaxis  . Bee Venom Swelling    Prior to Admission medications   Medication Sig Start Date End Date Taking? Authorizing Provider  amitriptyline (ELAVIL) 75 MG tablet Take 1 tablet (75 mg total) by mouth at bedtime. 03/03/12  Yes Roderick Pee, MD  cevimeline [facility]) 30 MG capsule Take 30 mg by mouth 3 (three) times daily.   Yes Historical Provider, MD  divalproex (DEPAKOTE) 250 MG DR tablet Take 250 mg by mouth daily. For migraine 03/03/12  Yes Roderick Pee, MD  rizatriptan (MAXALT) 10 MG tablet Take 1 tablet (10 mg total) by mouth as needed for migraine. May repeat in 2 hours if needed 03/03/12  Yes Roderick Pee, MD  EPINEPHrine [facility] JR) 0.15 MG/0.3ML injection Inject 0.15 mg into the muscle as needed.      Historical Provider, MD      (Not in a hospital admission)  Results for orders placed during the hospital encounter of 07/05/12 (from the past 48 hour(s))  CBC WITH DIFFERENTIAL     Status: Abnormal   Collection Time   07/05/12  1:31 PM      Component Value Range Comment   WBC 7.1  4.0 - 10.5 K/uL    RBC 4.28  4.22 - 5.81 MIL/uL    Hemoglobin 13.2  13.0 - 17.0 g/dL    HCT 16.1 (*) 09.6 - 52.0 %    MCV 90.0  78.0 - 100.0 fL    MCH 30.8  26.0 - 34.0 pg    MCHC 34.3  30.0 - 36.0 g/dL    RDW 04.5  40.9 - 81.1 %    Platelets 166  150 - 400 K/uL    Neutrophils Relative 84 (*) 43 - 77 %    Neutro Abs 5.9  1.7 - 7.7 K/uL    Lymphocytes Relative 4 (*) 12 - 46 %    Lymphs Abs 0.3 (*) 0.7 - 4.0 K/uL    Monocytes Relative 12  3 - 12 %    Monocytes Absolute 0.8  0.1 - 1.0 K/uL    Eosinophils Relative 0  0 - 5 %    Eosinophils Absolute 0.0  0.0 - 0.7 K/uL    Basophils Relative 0  0 - 1 %    Basophils Absolute 0.0  0.0 - 0.1 K/uL   COMPREHENSIVE METABOLIC PANEL     Status: Abnormal   Collection Time   07/05/12  1:31 PM      Component Value Range Comment   Sodium 134 (*) 135 - 145 mEq/L    Potassium 4.4   3.5 - 5.1 mEq/L    Chloride 95 (*) 96 - 112 mEq/L    CO2 31  19 - 32 mEq/L    Glucose, Bld 85  70 - 99 mg/dL    BUN 20  6 - 23 mg/dL    Creatinine, Ser 9.14  0.50 - 1.35 mg/dL    Calcium 9.6  8.4 - 78.2 mg/dL    Total Protein 7.4  6.0 - 8.3 g/dL    Albumin 3.9  3.5 - 5.2 g/dL    AST 28  0 - 37 U/L    ALT 23  0 - 53 U/L    Alkaline Phosphatase 91  39 - 117 U/L    Total Bilirubin 0.6  0.3 - 1.2 mg/dL    GFR calc non Af Amer 82 (*) >90 mL/min    GFR calc Af Amer >90  >90 mL/min   LIPASE, BLOOD     Status: Normal   Collection Time   07/05/12  1:31 PM      Component Value Range Comment   Lipase 29  11 - 59 U/L   URINALYSIS, ROUTINE W REFLEX MICROSCOPIC     Status: Abnormal   Collection Time   07/05/12  3:23 PM      Component Value Range Comment   Color, Urine AMBER (*) YELLOW BIOCHEMICALS MAY BE AFFECTED BY COLOR   APPearance CLEAR  CLEAR    Specific Gravity, Urine 1.029  1.005 - 1.030    pH 6.0  5.0 - 8.0    Glucose, UA NEGATIVE  NEGATIVE mg/dL    Hgb urine dipstick NEGATIVE  NEGATIVE    Bilirubin Urine NEGATIVE  NEGATIVE    Ketones, ur NEGATIVE  NEGATIVE mg/dL    Protein, ur NEGATIVE  NEGATIVE mg/dL    Urobilinogen, UA 1.0  0.0 - 1.0 mg/dL    Nitrite NEGATIVE  NEGATIVE    Leukocytes, UA NEGATIVE  NEGATIVE MICROSCOPIC NOT DONE ON URINES WITH NEGATIVE PROTEIN, BLOOD, LEUKOCYTES, NITRITE, OR GLUCOSE <1000 mg/dL.   Ct Abdomen Pelvis W Contrast  07/05/2012  *RADIOLOGY REPORT*  Clinical  Data: Right lower quadrant pain.  Rule out appendicitis. Cancer of the tonsil.  CT ABDOMEN AND PELVIS WITH CONTRAST  Technique:  Multidetector CT imaging of the abdomen and pelvis was performed following the standard protocol during bolus administration of intravenous contrast.  Contrast: OMNIPAQUE IOHEXOL 300 MG/ML  SOLN  Comparison: None.  Findings: Lung bases are clear.  Liver and gallbladder are normal. Pancreas spleen and kidneys are normal.  Accessory spleen is noted measuring 15 mm anterior to the  spleen.  The appendix is distended and shows increased enhancement and infiltration of the surrounding fat.  There is thickening of the lateral abdominal fascia.  Findings are compatible with acute appendicitis.  Retrocecal appendix.  No free fluid or free air. Negative for appendicolith.  Negative for bowel obstruction.  Mild constipation.  Prostate is enlarged.  Mild sigmoid diverticulosis.  IMPRESSION: Acute appendicitis without evidence of abscess or perforation.  I discussed the findings by telephone with Dr. Patria Mane.   Original Report Authenticated By: Camelia Phenes, M.D.     Review of Systems  Constitutional: Positive for fever and weight loss (34 lb wt loss with throat cancer, chemo and radiation.). Negative for chills, malaise/fatigue and diaphoresis.  HENT: Negative.   Eyes: Negative.   Respiratory: Negative for cough, hemoptysis, sputum production, shortness of breath and wheezing.        He has some trouble with swallowing, throat is narrow from radiation Rx.  Cardiovascular: Negative.   Gastrointestinal: Positive for heartburn (0occasional), nausea (one episode earlier today), abdominal pain (primairly on the right side.  Currently RLQ) and constipation. Negative for vomiting, diarrhea, blood in stool and melena.  Genitourinary:       Slow start of stream for some time.  Musculoskeletal: Negative.   Skin: Negative.   Neurological: Negative.   Endo/Heme/Allergies: Negative.   Psychiatric/Behavioral: Negative.     Blood pressure 107/70, pulse 90, temperature 98.9 F (37.2 C), temperature source Oral, resp. rate 20, SpO2 100.00%. Physical Exam  Constitutional: He is oriented to person, place, and time. He appears well-developed and well-nourished. No distress.  HENT:  Head: Normocephalic and atraumatic.  Nose: Nose normal.  Eyes: Conjunctivae and EOM are normal. Pupils are equal, round, and reactive to light. Right eye exhibits no discharge. Left eye exhibits no discharge.    Neck: Normal range of motion. Neck supple. No JVD present. No tracheal deviation present. No thyromegaly present.  Cardiovascular: Normal rate, regular rhythm, normal heart sounds and intact distal pulses.  Exam reveals no gallop.   No murmur heard. Respiratory: Effort normal and breath sounds normal. No stridor. No respiratory distress. He has no wheezes. He has no rales. He exhibits no tenderness.  GI: Soft. Bowel sounds are normal. He exhibits no distension and no mass. There is tenderness (pain and tender RLQ). There is no rebound and no guarding.  Musculoskeletal: Normal range of motion. He exhibits no edema and no tenderness.  Lymphadenopathy:    He has no cervical adenopathy.  Neurological: He is alert and oriented to person, place, and time. He has normal reflexes. No cranial nerve deficit.  Skin: Skin is warm and dry. No rash noted. He is not diaphoretic. No erythema.  Psychiatric: He has a normal mood and affect. His behavior is normal. Judgment and thought content normal.     Assessment/Plan Acute Appendicitis Hx of throat cancer with radiation and Chemotherapy Migraines Weight loss with cancer therapy  Plan:  We have contacted anesthesia so they can evaluate for intubation.  We have posted him for possible surgery.  Will make a final decision after he's seen by Dr. Luisa Hart and Anesthesia.  If intubation is an issue we may hold off until the AM and get ENT involved.   Devera Englander 07/05/2012, 5:04 PM

## 2012-07-05 NOTE — ED Provider Notes (Addendum)
History     CSN: [id number]  Arrival date & time 07/05/12  1238   First MD Initiated Contact with Patient 07/05/12 1312      Chief Complaint  Patient presents with  . Abdominal Pain    (Consider location/radiation/quality/duration/timing/severity/associated sxs/prior treatment) The history is provided by the patient.  EULIS SALAZAR is a 46 y.o. male hx of oropharyngeal cancer in remission here with abdominal pain. Intermittent abdominal pain x 3 weeks. Had fever x 2 days, Tmax 102.7. Pain worse for last 2 days. Its periumbilical and RLQ. No nausea or vomiting. No urinary symptoms or constipation or diarrhea. Sent by PMD for r/o appendicitis.    Past Medical History  Diagnosis Date  . Allergy   . Headache   . Gastritis   . 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 Weight loss, abnormal 2012    PEG tube placement Oct 2012  . 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 History of radiation therapy 07/01/11 -08/19/11    right tonsil  . Migraines   . Nasal septal perforation     hx of chronic  . Anxiety and depression     Past Surgical History  Procedure Date  . Nasal sinus surgery   . Peg tube placement 08/09/11 at [facility] IR    Family History  Problem Relation Age of Onset  . GI problems Mother     crohns  . Asthma Other   . Cancer Other     prostate,skin  . Leukemia Mother     ALL age 63  . Cancer Father     pancreatic    History  Substance Use Topics  . Smoking status: Never Smoker   . Smokeless tobacco: Not on file  . Alcohol Use: Yes     1 - 2 ALCOHOLIC BEVERAGES WEEKLY      Review of Systems  Gastrointestinal: Positive for abdominal pain.  All other systems reviewed and are negative.    Allergies  Naproxen sodium and Bee venom  Home Medications   Current Outpatient Rx  Name Route Sig Dispense Refill  . AMITRIPTYLINE HCL 75 MG PO TABS Oral Take 1 tablet (75 mg total) by mouth at bedtime. 100 tablet 3  . CEVIMELINE HCL 30 MG PO  CAPS Oral Take 30 mg by mouth 3 (three) times daily.    Marland Kitchen DIVALPROEX SODIUM 250 MG PO TBEC Oral Take 250 mg by mouth daily. For migraine    . RIZATRIPTAN BENZOATE 10 MG PO TABS Oral Take 1 tablet (10 mg total) by mouth as needed for migraine. May repeat in 2 hours if needed 6 tablet 1  . EPINEPHRINE 0.15 MG/0.3ML IJ DEVI Intramuscular Inject 0.15 mg into the muscle as needed.        BP 101/68  Pulse 89  Temp 98.8 F (37.1 C) (Oral)  SpO2 100%  Physical Exam  Nursing note and vitals reviewed. Constitutional: He is oriented to person, place, and time. He appears well-developed and well-nourished.  HENT:  Head: Normocephalic.  Mouth/Throat: Oropharynx is clear and moist.  Eyes: Conjunctivae are normal. Pupils are equal, round, and reactive to light.  Neck: Normal range of motion. Neck supple.  Cardiovascular: Normal rate, regular rhythm and normal heart sounds.   Pulmonary/Chest: Effort normal and breath sounds normal.  Abdominal: Soft.       + mild RLQ tenderness, no rebound. No CVAT.   Musculoskeletal: Normal range of motion. He exhibits no edema.  Neurological:  He is alert and oriented to person, place, and time.  Skin: Skin is warm and dry.  Psychiatric: He has a normal mood and affect. His behavior is normal. Judgment and thought content normal.    ED Course  Procedures (including critical care time)  Labs Reviewed  CBC WITH DIFFERENTIAL - Abnormal; Notable for the following:    HCT 38.5 (*)     Neutrophils Relative 84 (*)     Lymphocytes Relative 4 (*)     Lymphs Abs 0.3 (*)     All other components within normal limits  COMPREHENSIVE METABOLIC PANEL - Abnormal; Notable for the following:    Sodium 134 (*)     Chloride 95 (*)     GFR calc non Af Amer 82 (*)     All other components within normal limits  LIPASE, BLOOD  URINALYSIS, ROUTINE W REFLEX MICROSCOPIC   No results found.   No diagnosis found.    MDM  LENNART GLADISH is a 46 y.o. male here with RLQ  pain. Will get CBC, CMP, lipase, UA, CT ab/pel to r/o appendicitis.   3:26 PM Labs showed nl CBC, CMP and lipase. UA pending. CT ab/pel pending. I signed out to Dr. Effie Shy.        Richardean Canal, MD 07/05/12 1526  Richardean Canal, MD 07/05/12 [phone]

## 2012-07-05 NOTE — H&P (Signed)
Pt seen examined and test results reviewed.  Has history of head and neck cancer making airway management difficult.  Discussed need for tracheostomy.  Plan will be to attempt intubation for lap appendectomy.If not possible let patient wake up and treat medically for 24 hours to see if he can be managed with antibiotics.  If unsuccessful,  He will need tracheostomy and formal appendectomy but he would like to avoid tracheostomy if possible.  Risks include bleeding,   Infection ,  Abscess,  Airway issues,  Death,  Organ injury,  And the need for open surgery.  He understands and agrees to proceed.

## 2012-07-05 NOTE — ED Notes (Signed)
General surgery at bedside. 

## 2012-07-05 NOTE — Progress Notes (Signed)
  Subjective:    Patient ID: Jason Luna, male    DOB: 04-19-1966, 46 y.o.   MRN: [id number]  HPI Jason Luna is a 46 year old male who comes in today for evaluation of fever and abdominal pain  He's had 3 episodes over the last 4 weeks. All the episodes of the same. It starts out with diffuse abdominal pain that then localizes to the right lower quadrant he has fever and chills. This episode started on Monday the fever is gone but the pain has persisted.  Review of systems otherwise negative except for the history of oral pharyngeal cancer currently asymptomatic   Review of Systems Gen. review of systems otherwise negative    Objective:   Physical Exam  Well-developed and nourished male no acute distress examination the abdomen is soft bowel sounds are normal tender right lower quadrant with rebound      Assessment & Plan:  Right lower quadrant abdominal pain probable acute appendicitis to the emergency room now

## 2012-07-05 NOTE — ED Notes (Signed)
Pt sent over from PCP. States that he has been having reoccurring abd pain for the past 3 weeks. Monday was the worst episode, states that pain was 10/10 with fever of 102.7. States that PCP thinks it appendicitis.

## 2012-07-06 ENCOUNTER — Encounter (HOSPITAL_COMMUNITY): Payer: Self-pay | Admitting: Surgery

## 2012-07-06 MED ORDER — OXYCODONE-ACETAMINOPHEN 5-325 MG PO TABS: 1.0000 | ORAL_TABLET | ORAL | Status: AC | PRN

## 2012-07-06 MED ORDER — ACETAMINOPHEN 325 MG PO TABS: 650.0000 mg | ORAL_TABLET | Freq: Four times a day (QID) | ORAL | Status: AC | PRN

## 2012-07-06 NOTE — Progress Notes (Signed)
1 Day Post-Op  Subjective: He feels really good, still a bit sore, but hasn't had pain med since last PM.  Clears only post op medicine.  Objective: Vital signs in last 24 hours: Temp:  [97.5 F (36.4 C)-99.2 F (37.3 C)] 97.6 F (36.4 C) (09/05 0543) Pulse Rate:  [73-90] 76  (09/05 0543) Resp:  [11-20] 18  (09/05 0543) BP: (94-117)/(57-77) 97/62 mmHg (09/05 0543) SpO2:  [95 %-100 %] 98 % (09/05 0543) Weight:  [68.947 kg (152 lb)] 68.947 kg (152 lb) (09/04 2200) Last BM Date: 07/04/12  Afebrile, VSS, BP is 94-117 range, no labs  Intake/Output from previous day: 09/04 0701 - 09/05 0700 In: 2258.3 [I.V.:2258.3] Out: 1850 [Urine:1850] Intake/Output this shift: Total I/O In: 500 [I.V.:500] Out: 800 [Urine:800]  General appearance: alert, cooperative and no distress Resp: clear to auscultation bilaterally GI: soft, few BS, incisions look good, not much flatus.  Lab Results:   [facility] 07/05/12 1331  WBC 7.1  HGB 13.2  HCT 38.5*  PLT 166    BMET  Basename 07/05/12 1331  NA 134*  K 4.4  CL 95*  CO2 31  GLUCOSE 85  BUN 20  CREATININE 1.05  CALCIUM 9.6   PT/INR No results found for this basename: LABPROT:2,INR:2 in the last 72 hours   Lab 07/05/12 1331  AST 28  ALT 23  ALKPHOS 91  BILITOT 0.6  PROT 7.4  ALBUMIN 3.9     Lipase     Component Value Date/Time   LIPASE 29 07/05/2012 1331     Studies/Results: Ct Abdomen Pelvis W Contrast  07/05/2012  *RADIOLOGY REPORT*  Clinical Data: Right lower quadrant pain.  Rule out appendicitis. Cancer of the tonsil.  CT ABDOMEN AND PELVIS WITH CONTRAST  Technique:  Multidetector CT imaging of the abdomen and pelvis was performed following the standard protocol during bolus administration of intravenous contrast.  Contrast: OMNIPAQUE IOHEXOL 300 MG/ML  SOLN  Comparison: None.  Findings: Lung bases are clear.  Liver and gallbladder are normal. Pancreas spleen and kidneys are normal.  Accessory spleen is noted  measuring 15 mm anterior to the spleen.  The appendix is distended and shows increased enhancement and infiltration of the surrounding fat.  There is thickening of the lateral abdominal fascia.  Findings are compatible with acute appendicitis.  Retrocecal appendix.  No free fluid or free air. Negative for appendicolith.  Negative for bowel obstruction.  Mild constipation.  Prostate is enlarged.  Mild sigmoid diverticulosis.  IMPRESSION: Acute appendicitis without evidence of abscess or perforation.  I discussed the findings by telephone with Dr. Patria Luna.   Original Report Authenticated By: Jason Luna, M.D.     Medications:    . amitriptyline  75 mg Oral QHS  . cevimeline  30 mg Oral TID  . divalproex  250 mg Oral Daily  . enoxaparin (LOVENOX) injection  40 mg Subcutaneous Q24H  . piperacillin-tazobactam (ZOSYN)  IV  3.375 g Intravenous Q8H  . sodium chloride  1,000 mL Intravenous Once  . SUMAtriptan  50 mg Oral Once  . DISCONTD: divalproex  250 mg Oral Daily    Assessment/Plan Appendicitis, s/p APPENDECTOMY LAPAROSCOPIC 07/05/12 Dr Jason Luna Hx of throat cancer with radiation and Chemotherapy  Migraines  Weight loss with cancer therapy  Plan:  Regular diet at lunch and home if he does well.  Walk      LOS: 1 day    Jason Luna 07/06/2012

## 2012-07-06 NOTE — Progress Notes (Signed)
Alonna Bartling M. Cary Lothrop, MD, FACS General, Bariatric, & Minimally Invasive Surgery [facility]  

## 2012-07-06 NOTE — Discharge Summary (Signed)
Tashauna Caisse M. Rudolpho Claxton, MD, FACS General, Bariatric, & Minimally Invasive Surgery [facility]  

## 2012-07-06 NOTE — Discharge Summary (Signed)
Physician Discharge Summary  Patient ID: KALIQ LEGE MRN: [id number] DOB/AGE: June 26, 1966 46 y.o.  Admit date: 07/05/2012 Discharge date: 07/06/2012  Admission Diagnoses: Acute Appendicitis  Hx of throat cancer with radiation and Chemotherapy  Migraines  Weight loss with cancer therapy   Discharge Diagnoses: Same Principal Problem:  *Acute appendicitis Active Problems:  History of throat cancer  Hx of migraines   PROCEDURES: Laparoscopic Appendectomy 07/05/12 DR. [facility] Course: Patient is a 46 year old male who presents with abdominal pain which started in the evening of 07/03/12. He was fine when he went to bed, but woke up with a temperature of 102 and pain on the right side. This improved but the pain would come and go throughout the day on Tuesday 07/04/12. He was able to eat dinner last night, his appetite very diminished. He still has pain this a.m. he did eat some cereal this morning. He's had one episode of nausea today and presented to the ER for evaluation. He was afebrile on admission, vital signs are stable. CT scan shows the appendix is distended with increased enhancement and infiltration around the surrounding fat consistent with acute appendicitis there is no abscess or perforation noted. White count 7100. Patient continues to have pain in his right lower quadrant, especially with ambulation.  Pt had surgery the night of admission and did well.  He was not using pain meds by noon the next day.  If he does well with regular diet we will let him go home later today.    Disposition: 06-Home-Health Care Svc  Discharge Orders    Future Appointments: Provider: Department: Dept Phone: Center:   11/08/2012 9:15 AM Windell Hummingbird [facility] [phone] None   11/08/2012 9:45 AM Myrtis Ser, NP [facility] [phone] None     Medication List  As of 07/06/2012 12:24 PM   TAKE these medications         acetaminophen 325 MG tablet   Commonly  known as: TYLENOL   Take 2 tablets (650 mg total) by mouth every 6 (six) hours as needed (or Temp > 100).      amitriptyline 75 MG tablet   Commonly known as: ELAVIL   Take 1 tablet (75 mg total) by mouth at bedtime.      cevimeline 30 MG capsule   Commonly known as: EVOXAC   Take 30 mg by mouth 3 (three) times daily.      divalproex 250 MG DR tablet   Commonly known as: DEPAKOTE   Take 250 mg by mouth daily. For migraine      EPINEPHrine 0.15 MG/0.3ML injection   Commonly known as: EPIPEN JR   Inject 0.15 mg into the muscle as needed.      oxyCODONE-acetaminophen 5-325 MG per tablet   Commonly known as: PERCOCET/ROXICET   Take 1-2 tablets by mouth every 4 (four) hours as needed.      rizatriptan 10 MG tablet   Commonly known as: MAXALT   Take 1 tablet (10 mg total) by mouth as needed for migraine. May repeat in 2 hours if needed           Follow-up Information    Follow up with CORNETT,THOMAS A., MD. Schedule an appointment as soon as possible for a visit in 2 weeks. (Make an appointment 2-3 weeks. Call if you have a problem sooner.)    Contact information:   [address] [place] [address] [phone]  Follow up with TODD,JEFFREY Freida Busman, MD. (Call for follow up appointment.)    Contact information:   [address] [place] [address] [phone]          Signed: Sherrie Jayceon 07/06/2012, 12:24 PM

## 2012-07-09 NOTE — Progress Notes (Signed)
Not applicable.  Before EPIC gone live.  

## 2012-07-20 ENCOUNTER — Other Ambulatory Visit: Payer: Self-pay | Admitting: Family Medicine

## 2012-09-06 ENCOUNTER — Encounter (HOSPITAL_BASED_OUTPATIENT_CLINIC_OR_DEPARTMENT_OTHER): Payer: Self-pay | Admitting: *Deleted

## 2012-09-06 NOTE — Progress Notes (Signed)
No labs needed Had lap append 9/13

## 2012-09-11 ENCOUNTER — Encounter (HOSPITAL_BASED_OUTPATIENT_CLINIC_OR_DEPARTMENT_OTHER): Payer: Self-pay | Admitting: Anesthesiology

## 2012-09-11 ENCOUNTER — Encounter (HOSPITAL_BASED_OUTPATIENT_CLINIC_OR_DEPARTMENT_OTHER): Payer: Self-pay | Admitting: *Deleted

## 2012-09-11 ENCOUNTER — Ambulatory Visit (HOSPITAL_BASED_OUTPATIENT_CLINIC_OR_DEPARTMENT_OTHER)
Admission: RE | Admit: 2012-09-11 | Discharge: 2012-09-11 | Disposition: A | Discharge status: Home / Self Care | Payer: 59 | Source: Ambulatory Visit | Attending: Otolaryngology | Admitting: Otolaryngology

## 2012-09-11 ENCOUNTER — Ambulatory Visit (HOSPITAL_BASED_OUTPATIENT_CLINIC_OR_DEPARTMENT_OTHER): Payer: 59 | Admitting: Anesthesiology

## 2012-09-11 ENCOUNTER — Encounter (HOSPITAL_BASED_OUTPATIENT_CLINIC_OR_DEPARTMENT_OTHER)
Admission: RE | Disposition: A | Discharge status: Home / Self Care | Payer: Self-pay | Source: Ambulatory Visit | Attending: Otolaryngology

## 2012-09-11 DIAGNOSIS — K219 Gastro-esophageal reflux disease without esophagitis: Secondary | ICD-10-CM | POA: Insufficient documentation

## 2012-09-11 DIAGNOSIS — K222 Esophageal obstruction: Secondary | ICD-10-CM | POA: Insufficient documentation

## 2012-09-11 DIAGNOSIS — R1314 Dysphagia, pharyngoesophageal phase: Secondary | ICD-10-CM | POA: Insufficient documentation

## 2012-09-11 HISTORY — DX: Gastro-esophageal reflux disease without esophagitis: K21.9

## 2012-09-11 HISTORY — PX: ESOPHAGEAL DILATION: SHX303

## 2012-09-11 SURGERY — DILATION, ESOPHAGUS
Anesthesia: General | Site: Mouth | Laterality: Bilateral | Wound class: Clean Contaminated

## 2012-09-11 MED ORDER — DEXAMETHASONE SODIUM PHOSPHATE 4 MG/ML IJ SOLN
INTRAMUSCULAR | Status: DC | PRN
  Administered 2012-09-11: 10 mg via INTRAVENOUS

## 2012-09-11 MED ORDER — EPHEDRINE SULFATE 50 MG/ML IJ SOLN
INTRAMUSCULAR | Status: DC | PRN
  Administered 2012-09-11: 10 mg via INTRAVENOUS

## 2012-09-11 MED ORDER — MIDAZOLAM HCL 5 MG/5ML IJ SOLN
INTRAMUSCULAR | Status: DC | PRN
  Administered 2012-09-11: 2 mg via INTRAVENOUS

## 2012-09-11 MED ORDER — PROPOFOL 10 MG/ML IV BOLUS
INTRAVENOUS | Status: DC | PRN
  Administered 2012-09-11: 200 mg via INTRAVENOUS

## 2012-09-11 MED ORDER — SUCCINYLCHOLINE CHLORIDE 20 MG/ML IJ SOLN
INTRAMUSCULAR | Status: DC | PRN
  Administered 2012-09-11: 100 mg via INTRAVENOUS

## 2012-09-11 MED ORDER — FENTANYL CITRATE 0.05 MG/ML IJ SOLN
INTRAMUSCULAR | Status: DC | PRN
  Administered 2012-09-11: 50 ug via INTRAVENOUS

## 2012-09-11 MED ORDER — OMEPRAZOLE 20 MG PO CPDR: 20.0000 mg | DELAYED_RELEASE_CAPSULE | Freq: Two times a day (BID) | ORAL | Status: AC

## 2012-09-11 MED ORDER — LACTATED RINGERS IV SOLN
INTRAVENOUS | Status: DC
  Administered 2012-09-11: 07:00:00 via INTRAVENOUS

## 2012-09-11 MED ORDER — HYDROMORPHONE HCL PF 1 MG/ML IJ SOLN: 0.2500 mg | INTRAMUSCULAR | Status: DC | PRN

## 2012-09-11 SURGICAL SUPPLY — 23 items
CANISTER SUCTION 1200CC (MISCELLANEOUS) ×2 IMPLANT
CLOTH BEACON ORANGE TIMEOUT ST (SAFETY) ×2 IMPLANT
CONT SPEC 4OZ CLIKSEAL STRL BL (MISCELLANEOUS) IMPLANT
GAUZE SPONGE 4X4 12PLY STRL LF (GAUZE/BANDAGES/DRESSINGS) ×4 IMPLANT
GLOVE BIO SURGEON STRL SZ 6.5 (GLOVE) ×2 IMPLANT
GLOVE SS BIOGEL STRL SZ 7.5 (GLOVE) ×1 IMPLANT
GLOVE SUPERSENSE BIOGEL SZ 7.5 (GLOVE) ×1
GOWN PREVENTION PLUS XLARGE (GOWN DISPOSABLE) ×2 IMPLANT
GOWN PREVENTION PLUS XXLARGE (GOWN DISPOSABLE) IMPLANT
GUARD TEETH (MISCELLANEOUS) IMPLANT
NEEDLE HYPO 18GX1.5 BLUNT FILL (NEEDLE) IMPLANT
NEEDLE SPNL 22GX7 QUINCKE BK (NEEDLE) IMPLANT
NS IRRIG 1000ML POUR BTL (IV SOLUTION) IMPLANT
PATTIES SURGICAL .5 X3 (DISPOSABLE) IMPLANT
SHEET MEDIUM DRAPE 40X70 STRL (DRAPES) ×2 IMPLANT
SLEEVE SCD COMPRESS KNEE MED (MISCELLANEOUS) ×2 IMPLANT
SOLUTION ANTI FOG 6CC (MISCELLANEOUS) IMPLANT
SURGILUBE 2OZ TUBE FLIPTOP (MISCELLANEOUS) ×4 IMPLANT
SYR 5ML LL (SYRINGE) IMPLANT
SYR CONTROL 10ML LL (SYRINGE) IMPLANT
TOWEL OR 17X24 6PK STRL BLUE (TOWEL DISPOSABLE) ×2 IMPLANT
TUBE CONNECTING 20X1/4 (TUBING) ×2 IMPLANT
WATER STERILE IRR 1000ML POUR (IV SOLUTION) IMPLANT

## 2012-09-11 NOTE — H&P (Signed)
PREOPERATIVE H&P  Chief Complaint: dysphgia  HPI: Jason Luna is a 46 y.o. male who presents for evaluation of dysphagia 1 year status post chemoradiation treatment for tonsil SCCa.He has trouble with food getting stuck at the level of the UES. He's taken to the OR for esophagoscopy and dilation.  Past Medical History  Diagnosis Date  . Allergy   . Headache   . Gastritis   . Weight loss, abnormal 2012    PEG tube placement Oct 2012  . 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 History of radiation therapy 07/01/11 -08/19/11    right tonsil  . Migraines   . Nasal septal perforation     hx of chronic  . Anxiety and depression   . History of throat cancer 07/05/2012    He's had radiation and chemotherapies   . Acute appendicitis 07/05/2012  . Hx of migraines 07/05/2012  . 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 GERD (gastroesophageal reflux disease)    Past Surgical History  Procedure Date  . Nasal sinus surgery   . Peg tube placement 08/09/11 at [facility] IR  . Laparoscopic appendectomy 07/05/2012    Procedure: APPENDECTOMY LAPAROSCOPIC;  Surgeon: Clovis Pu. Cornett, MD;  Location: [facility] ORS;  Service: General;  Laterality: N/A;  . Peg tube removal    History   Social History  . Marital Status: Divorced    Spouse Name: N/A    Number of Children: N/A  . Years of Education: N/A   Social History Main Topics  . Smoking status: Never Smoker   . Smokeless tobacco: None  . Alcohol Use: Yes     Comment: 1 - 2 ALCOHOLIC BEVERAGES WEEKLY  . Drug Use: Yes     Comment: COCAINE USE OVER 10 YRS AGO  . Sexually Active: None   Other Topics Concern  . None   Social History Narrative  . None   Family History  Problem Relation Age of Onset  . GI problems Mother     crohns  . Asthma Other   . Cancer Other     prostate,skin  . Leukemia Mother     ALL age 12  . Cancer Father     pancreatic   Allergies  Allergen Reactions  . Naproxen Sodium  Anaphylaxis  . Bee Venom Swelling  . Zofran (Ondansetron Hcl)    Prior to Admission medications   Medication Sig Start Date End Date Taking? Authorizing Provider  acetaminophen (TYLENOL) 325 MG tablet Take 2 tablets (650 mg total) by mouth every 6 (six) hours as needed (or Temp > 100). 07/06/12 07/06/13 Yes Sherrie Ashok, PA  amitriptyline (ELAVIL) 75 MG tablet Take 1 tablet (75 mg total) by mouth at bedtime. 03/03/12  Yes Roderick Pee, MD  cevimeline [facility]) 30 MG capsule Take 30 mg by mouth 3 (three) times daily.   Yes Historical Provider, MD  divalproex (DEPAKOTE) 250 MG DR tablet Take 250 mg by mouth daily. For migraine 03/03/12  Yes Roderick Pee, MD  omeprazole (PRILOSEC) 20 MG capsule Take 20 mg by mouth as needed.   Yes Historical Provider, MD  EPINEPHrine (EPIPEN JR) 0.15 MG/0.3ML injection Inject 0.15 mg into the muscle as needed.      Historical Provider, MD  rizatriptan (MAXALT) 10 MG tablet take 1 tablet by mouth if needed for migraines  (MAY REPEAT IN 2 HOURS IF NEEDED) 07/20/12   Roderick Pee, MD     Positive ROS:  trouble swallowing and burning with certain foods  All other systems have been reviewed and were otherwise negative with the exception of those mentioned in the HPI and as above.  Physical Exam: Filed Vitals:   09/11/12 0632  BP: 106/73  Pulse: 79  Temp: 97.7 F (36.5 C)  Resp: 16    General: Alert, no acute distress Oral: Normal oral mucosa and tonsils Nasal: Clear nasal passages Neck: No palpable adenopathy or thyroid nodules Ear: Ear canal is clear with normal appearing TMs Cardiovascular: Regular rate and rhythm, no murmur.  Respiratory: Clear to auscultation Neurologic: Alert and oriented x 3   Assessment/Plan: ESOPHAGEAL STENOSIS Plan for Procedure(s): ESOPHAGEAL DILATION   Dillard Cannon, MD 09/11/2012 7:29 AM

## 2012-09-11 NOTE — Anesthesia Postprocedure Evaluation (Signed)
  Anesthesia Post-op Note  Patient: Jason Luna  Procedure(s) Performed: Procedure(s) (LRB) with comments: ESOPHAGEAL DILATION (Bilateral) - ESOPHAGOSCOPY WITH SAVORY DILATORS   Patient Location: PACU  Anesthesia Type:General  Level of Consciousness: awake  Airway and Oxygen Therapy: Patient Spontanous Breathing  Post-op Pain: mild  Post-op Assessment: Post-op Vital signs reviewed  Post-op Vital Signs: Reviewed  Complications: No apparent anesthesia complications

## 2012-09-11 NOTE — Anesthesia Procedure Notes (Signed)
Procedure Name: Intubation Date/Time: 09/11/2012 7:42 AM Performed by: Gar Gibbon Pre-anesthesia Checklist: Patient identified, Emergency Drugs available, Suction available and Patient being monitored Patient Re-evaluated:Patient Re-evaluated prior to inductionOxygen Delivery Method: Circle System Utilized Preoxygenation: Pre-oxygenation with 100% oxygen Intubation Type: IV induction Ventilation: Mask ventilation without difficulty Laryngoscope Size: Miller and 3 Grade View: Grade IV Tube type: Oral Tube size: 7.0 mm Number of attempts: 1 Airway Equipment and Method: stylet and oral airway Placement Confirmation: ETT inserted through vocal cords under direct vision,  positive ETCO2 and breath sounds checked- equal and bilateral Secured at: 22 cm Tube secured with: Tape Dental Injury: Teeth and Oropharynx as per pre-operative assessment  Difficulty Due To: Difficulty was anticipated, Difficult Airway-  due to edematous airway and Difficult Airway- due to limited oral opening Future Recommendations: Recommend- induction with short-acting agent, and alternative techniques readily available

## 2012-09-11 NOTE — Anesthesia Preprocedure Evaluation (Addendum)
Anesthesia Evaluation  Patient identified by MRN, date of birth, ID band Patient awake    Reviewed: Allergy & Precautions, H&P , NPO status , Patient's Chart, lab work & pertinent test results  Airway Mallampati: II      Dental   Pulmonary neg pulmonary ROS,  breath sounds clear to auscultation        Cardiovascular negative cardio ROS  Rhythm:Regular Rate:Normal     Neuro/Psych    GI/Hepatic Neg liver ROS, GERD-  ,History of esophageal cancer.   Endo/Other  negative endocrine ROS  Renal/GU      Musculoskeletal   Abdominal   Peds  Hematology negative hematology ROS (+)   Anesthesia Other Findings   Reproductive/Obstetrics                          Anesthesia Physical Anesthesia Plan  ASA: III  Anesthesia Plan: General   Post-op Pain Management:    Induction: Intravenous  Airway Management Planned: Oral ETT  Additional Equipment:   Intra-op Plan:   Post-operative Plan: Extubation in OR  Informed Consent: I have reviewed the patients History and Physical, chart, labs and discussed the procedure including the risks, benefits and alternatives for the proposed anesthesia with the patient or authorized representative who has indicated his/her understanding and acceptance.   Dental advisory given  Plan Discussed with: CRNA, Surgeon and Anesthesiologist  Anesthesia Plan Comments:        Anesthesia Quick Evaluation

## 2012-09-11 NOTE — Transfer of Care (Signed)
Immediate Anesthesia Transfer of Care Note  Patient: Jason Luna  Procedure(s) Performed: Procedure(s) (LRB) with comments: ESOPHAGEAL DILATION (Bilateral) - ESOPHAGOSCOPY WITH SAVORY DILATORS   Patient Location: PACU  Anesthesia Type:General  Level of Consciousness: awake, sedated and patient cooperative  Airway & Oxygen Therapy: Patient Spontanous Breathing and aerosol face mask  Post-op Assessment: Report given to PACU RN and Post -op Vital signs reviewed and stable  Post vital signs: Reviewed and stable  Complications: No apparent anesthesia complications

## 2012-09-11 NOTE — Brief Op Note (Signed)
09/11/2012  8:07 AM  PATIENT:  Jason Luna  46 y.o. male  PRE-OPERATIVE DIAGNOSIS:  ESOPHAGEAL STENOSIS  POST-OPERATIVE DIAGNOSIS:  esophageal stenois  PROCEDURE:  Procedure(s) (LRB) with comments: ESOPHAGEAL DILATION (Bilateral) - ESOPHAGOSCOPY WITH SAVORY DILATORS   SURGEON:  Surgeon(s) and Role:    * Drema Halon, MD - Primary  PHYSICIAN ASSISTANT:   ASSISTANTS: none   ANESTHESIA:   general  EBL:     BLOOD ADMINISTERED:none  DRAINS: none   LOCAL MEDICATIONS USED:  NONE  SPECIMEN:  No Specimen  DISPOSITION OF SPECIMEN:  N/A  COUNTS:  YES  TOURNIQUET:  * No tourniquets in log *  DICTATION: .Other Dictation: Dictation Number [id number]  PLAN OF CARE: Discharge to home after PACU  PATIENT DISPOSITION:  PACU - hemodynamically stable.   Delay start of Pharmacological VTE agent (>24hrs) due to surgical blood loss or risk of bleeding: not applicable

## 2012-09-12 ENCOUNTER — Encounter (HOSPITAL_BASED_OUTPATIENT_CLINIC_OR_DEPARTMENT_OTHER): Payer: Self-pay | Admitting: Otolaryngology

## 2012-09-12 NOTE — Op Note (Signed)
NAME:  Jason Luna, Jason Luna NO.:  [id number]  MEDICAL RECORD NO.:  [id number]  LOCATION:                                 FACILITY:  PHYSICIAN:  Kristine Garbe. Ezzard Standing, M.D.DATE OF BIRTH:  12/03/1965  DATE OF PROCEDURE:  09/11/2012 DATE OF DISCHARGE:                              OPERATIVE REPORT   PREOPERATIVE DIAGNOSIS:  Dysphagia with upper esophageal stenosis.  POSTOPERATIVE DIAGNOSIS:  Dysphagia with upper esophageal stenosis.  OPERATION PERFORMED:  Dilation of upper esophageal stenosis with Savary dilators 10 mm to 18 mm.  Esophagoscopy.  SURGEON:  Kristine Garbe. Ezzard Standing, M.D.  ANESTHESIA:  General endotracheal.  COMPLICATIONS:  None.  BRIEF CLINICAL INDICATION:  [name] is a 46 year old gentleman who is 1-year status post completion of chemoradiation treatment for tonsil cancer.  He has done well, but has continued to have trouble with dysphagia.  He has certain amount of sea things in food that caused burning in his mouth.  He has chronic dry mouth that has improved some with Ibosat, but he has also had trouble swallowing with food getting stuck in the proximal level of the cricoid cartilage or the level of the upper esophageal sphincter.  This has been a major issue for him and he is interested to see if there is anything that can be done to improve this.  He was taken to the operating room at this time for dilation of upper esophageal sphincter.  DESCRIPTION OF PROCEDURE:  After adequate endotracheal anesthesia, first direct laryngoscopy was performed.  The base of tongue, vallecula, epiglottis were all clear.  Both piriform sinuses were clear.  The patient had slightly rigid neck, but was able to evaluate the false and true cords, which were clear.  Esophagoscopy was attempted, but was unable to pass the esophagoscope partially because of the rigidity of his neck and prominence of the cervical spine.  At this point, the guidewire for the Savary  dilators was passed down to the level of 30 cm that is teeth.  Starting with the 10-mm Savary dilator, this was lubricated and passed without much difficulty.  He had subsequent dilations up to 18-mm Savary dilator.  This completed the procedure. Again, at the completion of the dilation up to 18-mm Savary dilator, rigid esophagoscopy was attempted, but was little bit difficult to pass and did not past the upper esophageal sphincter because of rigidity of his neck.  Little bit of blood was aspirated from the hypopharynx and the procedure was terminated.  The patient was awakened from anesthesia and transferred to the recovery room, postop doing well.  DISPOSITION:  [name] was discharged home later this morning on his normal medications in addition to antacid medication.  We will have him follow up in 1-2 weeks for recheck in my office.          ______________________________ Kristine Garbe. Ezzard Standing, M.D.     CEN/MEDQ  D:  09/11/2012  T:  09/11/2012  Job:  [id number]

## 2012-11-08 ENCOUNTER — Encounter: Payer: Self-pay | Admitting: Oncology

## 2012-11-08 ENCOUNTER — Telehealth: Payer: Self-pay | Admitting: Oncology

## 2012-11-08 ENCOUNTER — Other Ambulatory Visit (HOSPITAL_BASED_OUTPATIENT_CLINIC_OR_DEPARTMENT_OTHER): Payer: 59 | Admitting: Lab

## 2012-11-08 ENCOUNTER — Ambulatory Visit (HOSPITAL_BASED_OUTPATIENT_CLINIC_OR_DEPARTMENT_OTHER): Payer: 59 | Admitting: Oncology

## 2012-11-08 VITALS — BP 114/72 | HR 94 | Temp 98.7°F | Resp 20 | Ht 68.0 in | Wt 166.1 lb

## 2012-11-08 DIAGNOSIS — C109 Malignant neoplasm of oropharynx, unspecified: Secondary | ICD-10-CM

## 2012-11-08 DIAGNOSIS — K121 Other forms of stomatitis: Secondary | ICD-10-CM

## 2012-11-08 DIAGNOSIS — K1230 Oral mucositis (ulcerative), unspecified: Secondary | ICD-10-CM

## 2012-11-08 LAB — COMPREHENSIVE METABOLIC PANEL (CC13)
Albumin: 4.1 g/dL (ref 3.5–5.0)
BUN: 18 mg/dL (ref 7.0–26.0)
CO2: 34 mEq/L — ABNORMAL HIGH (ref 22–29)
Calcium: 9.9 mg/dL (ref 8.4–10.4)
Chloride: 98 mEq/L (ref 98–107)
Creatinine: 1.2 mg/dL (ref 0.7–1.3)
Glucose: 109 mg/dl — ABNORMAL HIGH (ref 70–99)

## 2012-11-08 LAB — CBC WITH DIFFERENTIAL/PLATELET
BASO%: 0.2 % (ref 0.0–2.0)
Basophils Absolute: 0 10*3/uL (ref 0.0–0.1)
Eosinophils Absolute: 0.1 10*3/uL (ref 0.0–0.5)
HCT: 40.6 % (ref 38.4–49.9)
HGB: 14 g/dL (ref 13.0–17.1)
LYMPH%: 12.5 % — ABNORMAL LOW (ref 14.0–49.0)
MCHC: 34.4 g/dL (ref 32.0–36.0)
MONO#: 0.6 10*3/uL (ref 0.1–0.9)
NEUT%: 74.4 % (ref 39.0–75.0)
Platelets: 183 10*3/uL (ref 140–400)
WBC: 5.3 10*3/uL (ref 4.0–10.3)

## 2012-11-08 NOTE — Progress Notes (Signed)
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   CURRENT THERAPY: watchful observation.  INTERVAL HISTORY: Jason Luna 47 y.o. male returns for regular follow up by himself.  He reports feeling well.  He still has some xerostomia.  However, he was prescribed cevimeline (Evoxac) by his ENT physician with significant improvement of his xerostomia.  His stamina has also improved.  Esophagus recently dilated by ENT and he is swallowing better. He has been able to work full time in addition to lots of household chores. [name] still has some neck swelling, and he still uses the compression garment at night with only residual neck edema.  He does not smoke/chew tobacco, or drink EtOH.  He does not notice any discrete neck swelling.  He denied dysphagia, odynophagia, aspiration.  Patient denies fever, anorexia, weight loss, fatigue, headache, visual changes, confusion, drenching night sweats, palpable lymph node swelling, mucositis, odynophagia, dysphagia, nausea vomiting, jaundice, chest pain, palpitation, shortness of breath, dyspnea on exertion, productive cough, gum bleeding, epistaxis, hematemesis, hemoptysis, abdominal pain, abdominal swelling, early satiety, melena, hematochezia, hematuria, skin rash, spontaneous bleeding, joint swelling, joint pain, heat or cold intolerance, bowel bladder incontinence, back pain, focal motor weakness, paresthesia, depression, suicidal or homocidal ideation, feeling hopelessness.   Past Medical History  Diagnosis Date  . Allergy   . Headache   . Gastritis   . Weight loss, abnormal 2012    PEG tube placement Oct 2012  . 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 History  of radiation therapy 07/01/11 -08/19/11    right tonsil  . Migraines   . Nasal septal perforation     hx of chronic  . Anxiety and depression   . History of throat cancer 07/05/2012    He's had radiation and chemotherapies   . Acute appendicitis 07/05/2012  . Hx of migraines 07/05/2012  . 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 GERD (gastroesophageal reflux disease)     Past Surgical History  Procedure Date  . Nasal sinus surgery   . Peg tube placement 08/09/11 at [facility] IR  . Laparoscopic appendectomy 07/05/2012    Procedure: APPENDECTOMY LAPAROSCOPIC;  Surgeon: Clovis Pu. Cornett, MD;  Location: [facility];  Service: General;  Laterality: N/A;  . Peg tube removal   . Esophageal dilation 09/11/2012    Procedure: ESOPHAGEAL DILATION;  Surgeon: Drema Halon, MD;  Location: [facility];  Service: ENT;  Laterality: Bilateral;  ESOPHAGOSCOPY WITH SAVORY DILATORS     Current Outpatient Prescriptions  Medication Sig Dispense Refill  . acetaminophen (TYLENOL) 325 MG tablet Take 2 tablets (650 mg total) by mouth every 6 (six) hours as needed (or Temp > 100).      Marland Kitchen amitriptyline (ELAVIL) 75 MG tablet Take 1 tablet (75 mg total) by mouth at bedtime.  100 tablet  3  . cevimeline (EVOXAC) 30 MG capsule Take 30 mg by mouth 3 (three) times daily.      . divalproex (DEPAKOTE) 250 MG DR tablet Take 250 mg by mouth daily. For migraine      . EPINEPHrine (EPIPEN JR) 0.15 MG/0.3ML injection Inject 0.15 mg into the muscle as needed.        Marland Kitchen omeprazole (  PRILOSEC) 20 MG capsule Take 1 capsule (20 mg total) by mouth 2 (two) times daily.  60 capsule  1  . rizatriptan (MAXALT) 10 MG tablet take 1 tablet by mouth if needed for migraines  (MAY REPEAT IN 2 HOURS IF NEEDED)  10 tablet  5    ALLERGIES:  is allergic to naproxen sodium; bee venom; and zofran.  REVIEW OF SYSTEMS:  The rest of the 14-point review of system was negative.   Filed Vitals:   11/08/12 0933  BP: 114/72    Pulse: 94  Temp: 98.7 F (37.1 C)  Resp: 20   Wt Readings from Last 3 Encounters:  11/08/12 166 lb 1.6 oz (75.342 kg)  09/11/12 158 lb (71.668 kg)  09/11/12 158 lb (71.668 kg)   ECOG Performance status: 0  PHYSICAL EXAMINATION:   General:  well-nourished man, in no acute distress.  Eyes:  no scleral icterus.  ENT:  There were no oropharyngeal lesions on my unaided exam.  Neck was without thyromegaly.  Lymphatics:  Negative cervical, supraclavicular or axillary adenopathy.  Respiratory: lungs were clear bilaterally without wheezing or crackles.  Cardiovascular:  Regular rate and rhythm, S1/S2, without murmur, rub or gallop.  There was no pedal edema.  GI:  abdomen was soft, flat, nontender, nondistended, without organomegaly.  Muscoloskeletal:  no spinal tenderness of palpation of vertebral spine.  Skin exam was without echymosis, petichae.  Neuro exam was nonfocal.  Patient was able to get on and off exam table without assistance.  Gait was normal.  Patient was alerted and oriented.  Attention was good.   Language was appropriate.  Mood was normal without depression.  Speech was not pressured.  Thought content was not tangential.      LABORATORY/RADIOLOGY DATA:  Lab Results  Component Value Date   WBC 5.3 11/08/2012   HGB 14.0 11/08/2012   HCT 40.6 11/08/2012   PLT 183 11/08/2012   GLUCOSE 109* 11/08/2012   CHOL 169 08/11/2010   TRIG 209.0* 08/11/2010   HDL 31.90* 08/11/2010   LDLDIRECT 103.0 08/11/2010   ALKPHOS 135 11/08/2012   ALT 25 11/08/2012   AST 23 11/08/2012   NA 140 11/08/2012   K 4.0 11/08/2012   CL 98 11/08/2012   CREATININE 1.2 11/08/2012   BUN 18.0 11/08/2012   CO2 34* 11/08/2012   INR 0.90 08/09/2011     ASSESSMENT AND PLAN:    1. Oropharynx squamous cell carcinoma:  There was no evidence of local or metastatic disease on today history, exam, and lab.  I advised him to follow up with his ENT service and Radiation Oncology in between visit with [place].  2. Calorie-protein malnutrition:  resolved.  His appetite has completely recovered.  3. 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  4. Anxiety/Depression: he is on amitriptyline and Depakote with his primary care physician 5. Follow up:  In about 6 months.  6. Age appropriate cancer screening:  I advised him to obtain a routine screening colonoscopy at age 51. 7. Mild normocytic anemia: Resolved.  The length of time of the face-to-face encounter was . More than 50% of time was spent counseling and coordination of care.

## 2012-11-08 NOTE — Telephone Encounter (Signed)
s.w. pt and gv July appt d.t...Marland Kitchenpt ok and aware

## 2012-11-20 DIAGNOSIS — K219 Gastro-esophageal reflux disease without esophagitis: Secondary | ICD-10-CM | POA: Insufficient documentation

## 2012-11-20 DIAGNOSIS — K2970 Gastritis, unspecified, without bleeding: Secondary | ICD-10-CM | POA: Insufficient documentation

## 2012-11-20 DIAGNOSIS — F329 Major depressive disorder, single episode, unspecified: Secondary | ICD-10-CM | POA: Insufficient documentation

## 2012-11-20 DIAGNOSIS — G43909 Migraine, unspecified, not intractable, without status migrainosus: Secondary | ICD-10-CM | POA: Insufficient documentation

## 2012-11-20 DIAGNOSIS — F419 Anxiety disorder, unspecified: Secondary | ICD-10-CM | POA: Insufficient documentation

## 2012-11-20 DIAGNOSIS — T7840XA Allergy, unspecified, initial encounter: Secondary | ICD-10-CM | POA: Insufficient documentation

## 2012-11-21 ENCOUNTER — Ambulatory Visit: Admission: RE | Admit: 2012-11-21 | Payer: 59 | Source: Ambulatory Visit | Admitting: Radiation Oncology

## 2012-11-21 ENCOUNTER — Ambulatory Visit (INDEPENDENT_AMBULATORY_CARE_PROVIDER_SITE_OTHER): Payer: 59 | Admitting: Family Medicine

## 2012-11-21 ENCOUNTER — Encounter: Payer: Self-pay | Admitting: Family Medicine

## 2012-11-21 VITALS — BP 110/80 | Temp 99.6°F | Wt 162.0 lb

## 2012-11-21 DIAGNOSIS — J101 Influenza due to other identified influenza virus with other respiratory manifestations: Secondary | ICD-10-CM

## 2012-11-21 MED ORDER — HYDROCODONE-HOMATROPINE 5-1.5 MG/5ML PO SYRP: 5.0000 mL | ORAL_SOLUTION | Freq: Three times a day (TID) | ORAL | Status: DC | PRN

## 2012-11-21 NOTE — Progress Notes (Signed)
  Subjective:    Patient ID: Jason Luna, male    DOB: 1966-01-01, 47 y.o.   MRN: [id number]  HPI Jason Luna is a 47 year old divorced male nonsmoker who comes in today with a five-day history of fever chills muscle aches joint pain cough.  He did not get the flu shot last fall     Review of Systems    general and pulmonary review of systems otherwise negative Objective:   Physical Exam Well-developed well nourished male no acute distress HEENT negative neck was supple no adenopathy lungs are clear       Assessment & Plan:

## 2012-11-21 NOTE — Patient Instructions (Signed)
Rest at home as long as you're having fever and chills  Tylenol or aspirin  Drink lots of liquids  Hydromet 1/2-1 teaspoon 3 times daily when necessary for cough and flu symptoms

## 2012-12-12 ENCOUNTER — Ambulatory Visit
Admission: RE | Admit: 2012-12-12 | Discharge: 2012-12-12 | Disposition: A | Discharge status: Home / Self Care | Payer: 59 | Source: Ambulatory Visit | Attending: Radiation Oncology | Admitting: Radiation Oncology

## 2012-12-12 VITALS — BP 115/73 | HR 83 | Temp 97.5°F | Wt 161.3 lb

## 2012-12-12 DIAGNOSIS — C109 Malignant neoplasm of oropharynx, unspecified: Secondary | ICD-10-CM

## 2012-12-12 NOTE — Progress Notes (Signed)
Patient here for routine follow up completion of radiation for tonsillar cancer on 08/19/2011.Patient has been doing well except for bout with flu.Dry mouth managed with evoxac.Weight maintained.Esophagus dilated in November 2013 by Dr.Newman

## 2012-12-12 NOTE — Progress Notes (Signed)
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 He is still bothered by neck edema which is less severe in cold weather. He underwent esophageal dilatation on November 11 with Dr. Ezzard Standing with a good outcome. More recently, he is been bothered by the flu and has been under the care of Dr. Tawanna Cooler. His weight remains stable although he's had some recent weight loss secondary to the flu.Marland Kitchen He continues with Evoxac for her xerostomia which works well for him. He continues to work full-time. He tells me he'll see Dr.  Ezzard Standing no later than March.    Physical examination: Alert and oriented. Wt Readings from Last 3 Encounters:  12/12/12 161 lb 4.8 oz (73.165 kg)  11/21/12 162 lb (73.483 kg)  11/08/12 166 lb 1.6 oz (75.342 kg)   Temp Readings from Last 3 Encounters:  12/12/12 97.5 F (36.4 C)   11/21/12 99.6 F (37.6 C) Oral  11/08/12 98.7 F (37.1 C) Oral   BP Readings from Last 3 Encounters:  12/12/12 115/73  11/21/12 110/80  11/08/12 114/72   Pulse Readings from Last 3 Encounters:  12/12/12 83  11/08/12 94  09/11/12 81   Nodes: There is no palpable lymphadenopathy in neck. There is slight edema along the anterior neck. Oral cavity and oropharynx are unremarkable to inspection. There is mild xerostomia. No visible or palpable evidence for recurrent disease along his right oropharynx. Indirect mirror examination likewise without evidence for recurrent disease.  Impression: Satisfactory progress with no evidence for recurrent disease.  Plan: Followup visit with me in approximately 4 months with expectation he'll see Dr. Ezzard Standing in 2 months. This coming October he will be 2 years out from completion of chemoradiation, and we can stretch out his followups .

## 2013-03-07 ENCOUNTER — Other Ambulatory Visit: Payer: Self-pay | Admitting: Family Medicine

## 2013-04-03 ENCOUNTER — Ambulatory Visit
Admission: RE | Admit: 2013-04-03 | Discharge: 2013-04-03 | Disposition: A | Discharge status: Home / Self Care | Payer: 59 | Source: Ambulatory Visit | Attending: Radiation Oncology | Admitting: Radiation Oncology

## 2013-04-03 VITALS — BP 119/84 | HR 103 | Temp 98.6°F | Wt 163.1 lb

## 2013-04-03 DIAGNOSIS — C109 Malignant neoplasm of oropharynx, unspecified: Secondary | ICD-10-CM

## 2013-04-03 NOTE — Progress Notes (Signed)
Patient for routine follow up completion of radiation of squamous cell cancer of right tonsil August 19, 2011.Overall patient is doing well.Avoids spicy foods.Has some difficulty swallowing and is scheduled to see Dr.Newman tomorrow for set up of possible dilation as was done on September 11, 2012.Office notified to forward note from November and tomorrow's visit.Continues to take evoxac for dry mouth.

## 2013-04-03 NOTE — Progress Notes (Signed)
CC: Dr. Narda Bonds  Followup note:  Mr. Wurzer returns today approximately one year and 8 months following completion of chemoradiation in the management of his T2 N1 squamous cell carcinoma of the right tonsil. His neck edema is improved. He believes that he is developing worsening pharyngeal stenosis which responded well to dilatation back in November of 2013 with Dr. Ezzard Standing. He continues with his Evoxac for her xerostomia with good results. He is also maintaining his dental followup. He'll see Dr. Gaylyn Rong on July 9 .  Physical examination: Alert and oriented. Filed Vitals:   04/03/13 1546  BP: 119/84  Pulse: 103  Temp: 98.6 F (37 C)   Nodes: There is no palpable lymphadenopathy in the neck. Oral cavity and oropharynx unremarkable to inspection. The oral cavity is moist. No visible or palpable evidence for recurrent disease along the right oropharynx. Indirect mirror examination confirmatory.  Impression: No evidence for recurrent disease. He'll see Dr. Ezzard Standing for discussion of possible repeat pharyngeal dilatation.  Plan: Followup visit with me in 4 months.

## 2013-04-10 ENCOUNTER — Other Ambulatory Visit: Payer: Self-pay | Admitting: *Deleted

## 2013-04-10 MED ORDER — EPINEPHRINE 0.3 MG/0.3ML IJ SOAJ: 0.3000 mg | Freq: Once | INTRAMUSCULAR | Status: DC

## 2013-04-11 ENCOUNTER — Encounter (HOSPITAL_BASED_OUTPATIENT_CLINIC_OR_DEPARTMENT_OTHER): Payer: Self-pay | Admitting: *Deleted

## 2013-04-11 NOTE — Progress Notes (Signed)
Pt does not need any labs-has been here for this 11/13-tol well

## 2013-04-12 ENCOUNTER — Encounter: Payer: Self-pay | Admitting: Family Medicine

## 2013-04-12 ENCOUNTER — Ambulatory Visit (INDEPENDENT_AMBULATORY_CARE_PROVIDER_SITE_OTHER): Payer: 59 | Admitting: Family Medicine

## 2013-04-12 VITALS — BP 120/94 | Temp 98.4°F | Wt 163.0 lb

## 2013-04-12 DIAGNOSIS — F1911 Other psychoactive substance abuse, in remission: Secondary | ICD-10-CM

## 2013-04-12 DIAGNOSIS — G43909 Migraine, unspecified, not intractable, without status migrainosus: Secondary | ICD-10-CM

## 2013-04-12 DIAGNOSIS — F5101 Primary insomnia: Secondary | ICD-10-CM | POA: Insufficient documentation

## 2013-04-12 DIAGNOSIS — G4700 Insomnia, unspecified: Secondary | ICD-10-CM

## 2013-04-12 MED ORDER — LORAZEPAM 1 MG PO TABS: 1.0000 mg | ORAL_TABLET | Freq: Two times a day (BID) | ORAL | Status: DC | PRN

## 2013-04-12 NOTE — Progress Notes (Signed)
  Subjective:    Patient ID: Jason Luna, male    DOB: [birth year], 47 y.o.   MRN: [id number]  HPI Isais is a 47 year old male nonsmoker who comes in today for evaluation of migraine headaches and insomnia  He was taking Elavil 25 mg each bedtime for sleep dysfunction. When he sleeps well and his migraines are minimal. Over the last couple months he said insomnia because he stopped the Elavil. He is side effects of constipation. He wants to discuss other options. The Maxalt does work when he has a migraine however on one occasion he took 4 pills. Advised never to take for only one and then one 2 hours later   Review of Systems Review of systems negative caffeine consumption minimal    Objective:   Physical Exam  Well-developed and nourished male no acute distress vital signs stable he is afebrile     Assessment & Plan:  Insomnia,,,,,,,,,, begin low-dose Ativan followup in 4 weeks  Migraine headaches continue Maxalt when necessary  Drug a screening test at the request of his wife

## 2013-04-12 NOTE — Patient Instructions (Signed)
Ativan 1 mg......... One half tablet at bedtime for 2 weeks.......... Then may increase to a full tablet if still not sleeping  Return in one month for followup  Maxalt when necessary for migraines,,,,,,,,, 1 stat,,,,,,,, then you may take one 2 hours later if the headache does not abate

## 2013-04-13 LAB — DRUG SCREEN, URINE
Amphetamine Screen, Ur: NEGATIVE
Benzodiazepines.: NEGATIVE
Cocaine Metabolites: NEGATIVE
Creatinine,U: 125.85 mg/dL
Phencyclidine (PCP): NEGATIVE

## 2013-04-16 NOTE — H&P (Signed)
PREOPERATIVE H&P  Chief Complaint: trouble swallowing  HPI: Jason Luna is a 47 y.o. male who presents for evaluation of trouble swallowing. The food gets caught in the same region it had previously to his dilation in November of last year. He is status post chemoradiation therapy of HPV + SCCa of the right tonsil and neck in Nov of 2012. He's taken to the OR for esophageal dilation of the stricture.  Past Medical History  Diagnosis Date  . Allergy   . Headache(784.0)   . Gastritis   . Weight loss, abnormal 2012    PEG tube placement Oct 2012  . 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 History of radiation therapy 07/01/11 -08/19/11    right tonsil  . Migraines   . Nasal septal perforation     hx of chronic  . Anxiety and depression   . History of throat cancer 07/05/2012    He's had radiation and chemotherapies   . Acute appendicitis 07/05/2012  . Hx of migraines 07/05/2012  . 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 GERD (gastroesophageal reflux disease)    Past Surgical History  Procedure Laterality Date  . Nasal sinus surgery    . Peg tube placement  08/09/11 at [facility] IR    peg out now-2014  . Laparoscopic appendectomy  07/05/2012    Procedure: APPENDECTOMY LAPAROSCOPIC;  Surgeon: Clovis Pu. Cornett, MD;  Location: [facility];  Service: General;  Laterality: N/A;  . Peg tube removal    . Esophageal dilation  09/11/2012    Procedure: ESOPHAGEAL DILATION;  Surgeon: Drema Halon, MD;  Location:  SUR[facility]ERY CENTER;  Service: ENT;  Laterality: Bilateral;  ESOPHAGOSCOPY WITH SAVORY DILATORS    History   Social History  . Marital Status: Divorced    Spouse Name: N/A    Number of Children: N/A  . Years of Education: N/A   Social History Main Topics  . Smoking status: Never Smoker   . Smokeless tobacco: None  . Alcohol Use: Yes     Comment: 1 - 2 ALCOHOLIC BEVERAGES WEEKLY  . Drug Use: Yes     Comment: COCAINE USE OVER 10 YRS AGO   . Sexually Active: None   Other Topics Concern  . None   Social History Narrative  . None   Family History  Problem Relation Age of Onset  . GI problems Mother     crohns  . Asthma Other   . Cancer Other     prostate,skin  . Leukemia Mother     ALL age 66  . Cancer Father     pancreatic   Allergies  Allergen Reactions  . Naproxen Sodium Anaphylaxis  . Bee Venom Swelling  . Zofran (Ondansetron Hcl)    Prior to Admission medications   Medication Sig Start Date End Date Taking? Authorizing Provider  acetaminophen (TYLENOL) 325 MG tablet Take 2 tablets (650 mg total) by mouth every 6 (six) hours as needed (or Temp > 100). 07/06/12 07/06/13  Sherrie Guthrie, PA-C  cevimeline [facility]) 30 MG capsule Take 30 mg by mouth 3 (three) times daily.    Historical Provider, MD  divalproex (DEPAKOTE) 250 MG DR tablet Take 250 mg by mouth daily. For migraine 03/03/12   Roderick Pee, MD  divalproex (DEPAKOTE) 250 MG DR tablet take 1 tablet by mouth once daily 03/07/13   Roderick Pee, MD  EPINEPHrine (EPIPEN) 0.3 mg/0.3 mL DEVI Inject 0.3 mLs (0.3  mg total) into the muscle once. 04/10/13   Roderick Pee, MD  LORazepam (ATIVAN) 1 MG tablet Take 1 tablet (1 mg total) by mouth 2 (two) times daily as needed for anxiety. 04/12/13   Roderick Pee, MD  omeprazole (PRILOSEC) 20 MG capsule Take 1 capsule (20 mg total) by mouth 2 (two) times daily. 09/11/12   Drema Halon, MD  rizatriptan (MAXALT) 10 MG tablet take 1 tablet by mouth if needed for migraines  (MAY REPEAT IN 2 HOURS IF NEEDED) 07/20/12   Roderick Pee, MD     Positive ROS: dysphagia  All other systems have been reviewed and were otherwise negative with the exception of those mentioned in the HPI and as above.  Physical Exam: There were no vitals filed for this visit.  General: Alert, no acute distress Oral: Normal oral mucosa and tonsils Nasal: Clear nasal passages Neck: No palpable adenopathy or thyroid nodules Ear: Ear  canal is clear with normal appearing TMs Cardiovascular: Regular rate and rhythm, no murmur.  Respiratory: Clear to auscultation Neurologic: Alert and oriented x 3   Assessment/Plan: esophageal stenosis Plan for Procedure(s): ESOPHAGEAL DILATION   Dillard Cannon, MD 04/16/2013 4:13 PM

## 2013-04-17 ENCOUNTER — Encounter (HOSPITAL_BASED_OUTPATIENT_CLINIC_OR_DEPARTMENT_OTHER): Payer: Self-pay | Admitting: Anesthesiology

## 2013-04-17 ENCOUNTER — Ambulatory Visit (HOSPITAL_BASED_OUTPATIENT_CLINIC_OR_DEPARTMENT_OTHER)
Admission: RE | Admit: 2013-04-17 | Discharge: 2013-04-17 | Disposition: A | Discharge status: Home / Self Care | Payer: 59 | Source: Ambulatory Visit | Attending: Otolaryngology | Admitting: Otolaryngology

## 2013-04-17 ENCOUNTER — Ambulatory Visit (HOSPITAL_BASED_OUTPATIENT_CLINIC_OR_DEPARTMENT_OTHER): Payer: 59 | Admitting: Anesthesiology

## 2013-04-17 ENCOUNTER — Encounter (HOSPITAL_BASED_OUTPATIENT_CLINIC_OR_DEPARTMENT_OTHER)
Admission: RE | Disposition: A | Discharge status: Home / Self Care | Payer: Self-pay | Source: Ambulatory Visit | Attending: Otolaryngology

## 2013-04-17 DIAGNOSIS — K222 Esophageal obstruction: Secondary | ICD-10-CM | POA: Insufficient documentation

## 2013-04-17 DIAGNOSIS — Z85819 Personal history of malignant neoplasm of unspecified site of lip, oral cavity, and pharynx: Secondary | ICD-10-CM | POA: Insufficient documentation

## 2013-04-17 DIAGNOSIS — K2960 Other gastritis without bleeding: Secondary | ICD-10-CM | POA: Insufficient documentation

## 2013-04-17 DIAGNOSIS — Z9221 Personal history of antineoplastic chemotherapy: Secondary | ICD-10-CM | POA: Insufficient documentation

## 2013-04-17 DIAGNOSIS — Z807 Family history of other malignant neoplasms of lymphoid, hematopoietic and related tissues: Secondary | ICD-10-CM | POA: Insufficient documentation

## 2013-04-17 DIAGNOSIS — G43909 Migraine, unspecified, not intractable, without status migrainosus: Secondary | ICD-10-CM | POA: Insufficient documentation

## 2013-04-17 DIAGNOSIS — Z8042 Family history of malignant neoplasm of prostate: Secondary | ICD-10-CM | POA: Insufficient documentation

## 2013-04-17 DIAGNOSIS — Z886 Allergy status to analgesic agent status: Secondary | ICD-10-CM | POA: Insufficient documentation

## 2013-04-17 DIAGNOSIS — Z888 Allergy status to other drugs, medicaments and biological substances status: Secondary | ICD-10-CM | POA: Insufficient documentation

## 2013-04-17 DIAGNOSIS — Z800 Family history of malignant neoplasm of digestive organs: Secondary | ICD-10-CM | POA: Insufficient documentation

## 2013-04-17 DIAGNOSIS — Z923 Personal history of irradiation: Secondary | ICD-10-CM | POA: Insufficient documentation

## 2013-04-17 DIAGNOSIS — Z79899 Other long term (current) drug therapy: Secondary | ICD-10-CM | POA: Insufficient documentation

## 2013-04-17 DIAGNOSIS — F1411 Cocaine abuse, in remission: Secondary | ICD-10-CM | POA: Insufficient documentation

## 2013-04-17 DIAGNOSIS — Z91038 Other insect allergy status: Secondary | ICD-10-CM | POA: Insufficient documentation

## 2013-04-17 DIAGNOSIS — K219 Gastro-esophageal reflux disease without esophagitis: Secondary | ICD-10-CM | POA: Insufficient documentation

## 2013-04-17 HISTORY — PX: ESOPHAGEAL DILATION: SHX303

## 2013-04-17 SURGERY — DILATION, ESOPHAGUS
Anesthesia: General | Site: Mouth | Wound class: Clean Contaminated

## 2013-04-17 MED ORDER — MIDAZOLAM HCL 5 MG/5ML IJ SOLN
INTRAMUSCULAR | Status: DC | PRN
  Administered 2013-04-17: 2 mg via INTRAVENOUS

## 2013-04-17 MED ORDER — LIDOCAINE HCL (CARDIAC) 20 MG/ML IV SOLN
INTRAVENOUS | Status: DC | PRN
  Administered 2013-04-17: 75 mg via INTRAVENOUS

## 2013-04-17 MED ORDER — PROPOFOL 10 MG/ML IV BOLUS
INTRAVENOUS | Status: DC | PRN
  Administered 2013-04-17: 200 mg via INTRAVENOUS

## 2013-04-17 MED ORDER — DEXAMETHASONE SODIUM PHOSPHATE 4 MG/ML IJ SOLN
INTRAMUSCULAR | Status: DC | PRN
  Administered 2013-04-17: 10 mg via INTRAVENOUS

## 2013-04-17 MED ORDER — MEPERIDINE HCL 25 MG/ML IJ SOLN: 6.2500 mg | INTRAMUSCULAR | Status: DC | PRN

## 2013-04-17 MED ORDER — AMOXICILLIN 250 MG/5ML PO SUSR: 500.0000 mg | Freq: Two times a day (BID) | ORAL | Status: AC

## 2013-04-17 MED ORDER — SUCCINYLCHOLINE CHLORIDE 20 MG/ML IJ SOLN
INTRAMUSCULAR | Status: DC | PRN
  Administered 2013-04-17: 100 mg via INTRAVENOUS

## 2013-04-17 MED ORDER — FENTANYL CITRATE 0.05 MG/ML IJ SOLN
INTRAMUSCULAR | Status: DC | PRN
  Administered 2013-04-17: 100 ug via INTRAVENOUS

## 2013-04-17 MED ORDER — OXYCODONE HCL 5 MG/5ML PO SOLN: 5.0000 mg | Freq: Once | ORAL | Status: DC | PRN

## 2013-04-17 MED ORDER — HYDROMORPHONE HCL PF 1 MG/ML IJ SOLN: 0.2500 mg | INTRAMUSCULAR | Status: DC | PRN

## 2013-04-17 MED ORDER — PROMETHAZINE HCL 25 MG/ML IJ SOLN: 6.2500 mg | INTRAMUSCULAR | Status: DC | PRN

## 2013-04-17 MED ORDER — OXYCODONE HCL 5 MG PO TABS: 5.0000 mg | ORAL_TABLET | Freq: Once | ORAL | Status: DC | PRN

## 2013-04-17 MED ORDER — LACTATED RINGERS IV SOLN
INTRAVENOUS | Status: DC
  Administered 2013-04-17 (×2): via INTRAVENOUS

## 2013-04-17 SURGICAL SUPPLY — 21 items
CANISTER SUCTION 1200CC (MISCELLANEOUS) ×2 IMPLANT
CLOTH BEACON ORANGE TIMEOUT ST (SAFETY) ×2 IMPLANT
CONT SPEC 4OZ CLIKSEAL STRL BL (MISCELLANEOUS) IMPLANT
GAUZE SPONGE 4X4 12PLY STRL LF (GAUZE/BANDAGES/DRESSINGS) ×4 IMPLANT
GLOVE BIO SURGEON STRL SZ7 (GLOVE) ×2 IMPLANT
GLOVE SS BIOGEL STRL SZ 7.5 (GLOVE) ×1 IMPLANT
GLOVE SUPERSENSE BIOGEL SZ 7.5 (GLOVE) ×1
GOWN PREVENTION PLUS XLARGE (GOWN DISPOSABLE) IMPLANT
GOWN PREVENTION PLUS XXLARGE (GOWN DISPOSABLE) ×2 IMPLANT
GUARD TEETH (MISCELLANEOUS) IMPLANT
NEEDLE HYPO 18GX1.5 BLUNT FILL (NEEDLE) IMPLANT
NEEDLE SPNL 22GX7 QUINCKE BK (NEEDLE) IMPLANT
NS IRRIG 1000ML POUR BTL (IV SOLUTION) IMPLANT
PATTIES SURGICAL .5 X3 (DISPOSABLE) IMPLANT
SHEET MEDIUM DRAPE 40X70 STRL (DRAPES) ×2 IMPLANT
SLEEVE SCD COMPRESS KNEE MED (MISCELLANEOUS) IMPLANT
SURGILUBE 2OZ TUBE FLIPTOP (MISCELLANEOUS) ×4 IMPLANT
SYR 5ML LL (SYRINGE) IMPLANT
SYR CONTROL 10ML LL (SYRINGE) IMPLANT
TOWEL OR 17X24 6PK STRL BLUE (TOWEL DISPOSABLE) ×2 IMPLANT
TUBE CONNECTING 20X1/4 (TUBING) ×2 IMPLANT

## 2013-04-17 NOTE — Brief Op Note (Signed)
04/17/2013  9:16 AM  PATIENT:  Jason Luna  47 y.o. male  PRE-OPERATIVE DIAGNOSIS:  esophageal stenosis  POST-OPERATIVE DIAGNOSIS:  esophageal stenosis  PROCEDURE:  Procedure(s): ESOPHAGEAL DILATION (N/A) Savary Dilators  10 - 19mm  SURGEON:  Surgeon(s) and Role:    * Drema Halon, MD - Primary  PHYSICIAN ASSISTANT:   ASSISTANTS: none   ANESTHESIA:   general  EBL:     BLOOD ADMINISTERED:none  DRAINS: none   LOCAL MEDICATIONS USED:  NONE  SPECIMEN:  No Specimen  DISPOSITION OF SPECIMEN:  N/A  COUNTS:  YES  TOURNIQUET:  * No tourniquets in log *  DICTATION: .Other Dictation: Dictation Number [id number]  PLAN OF CARE: Discharge to home after PACU  PATIENT DISPOSITION:  PACU - hemodynamically stable.   Delay start of Pharmacological VTE agent (>24hrs) due to surgical blood loss or risk of bleeding: not applicable

## 2013-04-17 NOTE — Transfer of Care (Signed)
Immediate Anesthesia Transfer of Care Note  Patient: Jason Luna  Procedure(s) Performed: Procedure(s): ESOPHAGEAL DILATION (N/A)  Patient Location: PACU  Anesthesia Type:General  Level of Consciousness: awake, alert  and oriented  Airway & Oxygen Therapy: Patient Spontanous Breathing and aerosol face mask  Post-op Assessment: Report given to PACU RN and Post -op Vital signs reviewed and stable  Post vital signs: Reviewed and stable  Complications: No apparent anesthesia complications

## 2013-04-17 NOTE — Anesthesia Procedure Notes (Signed)
Procedure Name: Intubation Date/Time: 04/17/2013 8:42 AM Performed by: Zenia Resides D Pre-anesthesia Checklist: Patient identified, Emergency Drugs available, Suction available and Patient being monitored Patient Re-evaluated:Patient Re-evaluated prior to inductionOxygen Delivery Method: Circle System Utilized Preoxygenation: Pre-oxygenation with 100% oxygen Intubation Type: IV induction Ventilation: Mask ventilation without difficulty Grade View: Grade III Tube type: Oral Tube size: 7.0 mm Number of attempts: 1 Airway Equipment and Method: stylet,  oral airway and Video-laryngoscopy Placement Confirmation: ETT inserted through vocal cords under direct vision,  positive ETCO2 and breath sounds checked- equal and bilateral Secured at: 23 cm Tube secured with: Tape Dental Injury: Teeth and Oropharynx as per pre-operative assessment  Difficulty Due To: Difficulty was anticipated, Difficult Airway- due to reduced neck mobility and Difficult Airway- due to limited oral opening Future Recommendations: Recommend- induction with short-acting agent, and alternative techniques readily available

## 2013-04-17 NOTE — Anesthesia Preprocedure Evaluation (Addendum)
Anesthesia Evaluation  Patient identified by MRN, date of birth, ID band Patient awake    Reviewed: Allergy & Precautions, H&P , NPO status , Patient's Chart, lab work & pertinent test results  History of Anesthesia Complications Negative for: history of anesthetic complications  Airway Mallampati: II  Neck ROM: Full  Mouth opening: Limited Mouth Opening  Dental  (+) Teeth Intact   Pulmonary  breath sounds clear to auscultation        Cardiovascular negative cardio ROS  Rhythm:Regular Rate:Normal     Neuro/Psych  Headaches,    GI/Hepatic Neg liver ROS, GERD-  ,Hx of throat ca c radiation, mucositis, esophageal stricture    Endo/Other  negative endocrine ROS  Renal/GU negative Renal ROS     Musculoskeletal   Abdominal   Peds  Hematology negative hematology ROS (+)   Anesthesia Other Findings   Reproductive/Obstetrics                        Anesthesia Physical Anesthesia Plan  ASA: II  Anesthesia Plan: General   Post-op Pain Management:    Induction: Intravenous  Airway Management Planned: Oral ETT  Additional Equipment:   Intra-op Plan:   Post-operative Plan: Extubation in OR  Informed Consent: I have reviewed the patients History and Physical, chart, labs and discussed the procedure including the risks, benefits and alternatives for the proposed anesthesia with the patient or authorized representative who has indicated his/her understanding and acceptance.   Dental advisory given  Plan Discussed with: CRNA and Surgeon  Anesthesia Plan Comments:        Anesthesia Quick Evaluation

## 2013-04-17 NOTE — Discharge Instructions (Addendum)
Take your regular meds. And take amoxicillin 500 mg twice per day for 1 week Start with soft food and advance diet as tolerated Call office for any questions or problems Call office for follow up appt in 10-14 days  [phone] Post Anesthesia Home Care Instructions  Activity: Get plenty of rest for the remainder of the day. A responsible adult should stay with you for 24 hours following the procedure.  For the next 24 hours, DO NOT: -Drive a car -Advertising copywriter -Drink alcoholic beverages -Take any medication unless instructed by your physician -Make any legal decisions or sign important papers.  Meals: Start with liquid foods such as gelatin or soup. Progress to regular foods as tolerated. Avoid greasy, spicy, heavy foods. If nausea and/or vomiting occur, drink only clear liquids until the nausea and/or vomiting subsides. Call your physician if vomiting continues.  Special Instructions/Symptoms: Your throat may feel dry or sore from the anesthesia or the breathing tube placed in your throat during surgery. If this causes discomfort, gargle with warm salt water. The discomfort should disappear within 24 hours.

## 2013-04-17 NOTE — Interval H&P Note (Signed)
History and Physical Interval Note:  04/17/2013 8:22 AM  Jason Luna  has presented today for surgery, with the diagnosis of esophageal stenosis  The various methods of treatment have been discussed with the patient and family. After consideration of risks, benefits and other options for treatment, the patient has consented to  Procedure(s): ESOPHAGEAL DILATION (N/A) as a surgical intervention .  The patient's history has been reviewed, patient examined, no change in status, stable for surgery.  I have reviewed the patient's chart and labs.  Questions were answered to the patient's satisfaction.     Trust Crago

## 2013-04-17 NOTE — Anesthesia Postprocedure Evaluation (Signed)
  Anesthesia Post-op Note  Patient: Jason Luna  Procedure(s) Performed: Procedure(s): ESOPHAGEAL DILATION (N/A)  Patient Location: PACU  Anesthesia Type:General  Level of Consciousness: awake  Airway and Oxygen Therapy: Patient Spontanous Breathing  Post-op Pain: mild  Post-op Assessment: Post-op Vital signs reviewed  Post-op Vital Signs: stable  Complications: No apparent anesthesia complications

## 2013-04-18 ENCOUNTER — Encounter (HOSPITAL_BASED_OUTPATIENT_CLINIC_OR_DEPARTMENT_OTHER): Payer: Self-pay | Admitting: Otolaryngology

## 2013-04-18 NOTE — Op Note (Signed)
NAME:  Jason Luna, Jason Luna NO.:  [id number]  MEDICAL RECORD NO.:  [id number]  LOCATION:                                 FACILITY:  PHYSICIAN:  Kristine Garbe. Ezzard Standing, M.D.DATE OF BIRTH:  12/11/1965  DATE OF PROCEDURE:  04/17/2013 DATE OF DISCHARGE:  04/17/2013                              OPERATIVE REPORT   PREOPERATIVE DIAGNOSIS:  Esophageal stenosis.  POSTOPERATIVE DIAGNOSIS:  Esophageal stenosis.  OPERATION:  Direct laryngoscopy with esophageal dilation using the Savary dilators, 11 mm to 19 mm.  SURGEON:  Kristine Garbe. Ezzard Standing, M.D.  ANESTHESIA:  General endotracheal.  COMPLICATIONS:  None.  BRIEF CLINICAL NOTE:  Jason Luna is a 47 year old gentleman who is year and a half status post completion of chemoradiation treatment for tonsil and neck cancer.  He developed trouble swallowing following treatment and underwent previous esophageal dilation 8 months ago with good improvement in the swallowing.  He has gradually got little bit worse with swelling over the last month with food getting caught in the upper cervical esophagus and desired to have repeat dilation.  He was taken to the operating room at this time for esophageal dilation with Savary dilators.  DESCRIPTION OF PROCEDURE:  After adequate endotracheal anesthesia, direct laryngoscopy was performed.  The oropharynx and hypopharynx were clear.  Base of tongue and vallecular epiglottis were normal.  Piriform sinuses appeared clear bilaterally.  The patient has fairly rigid neck making cervical esophagoscopy difficult, but using the anterior laryngoscope, I was able to pass the guidewire, the Savary dilator down through the upper esophagus to approximately 30 mm from the teeth.  Then starting with the 10-mm Savary dilators, subsequent dilation of the esophagus was performed up to 19 mm.  Following dilation to 19 mm, cervical esophagoscopy was engaged again attempted, but it was very difficult  because of the patient's neck rigidity, but I was able to visualize down to the opening of the cervical esophagus.  This completed the procedure.  Jason Luna was subsequently awoke from anesthesia and transferred to the recovery room, postop doing well.  DISPOSITION:  Jason Luna was discharged home later this morning.  He was instructed to start with a soft diet and advance diet as tolerated, placed him on amoxicillin suspension 400 mg b.i.d. for a week.  We will have him follow up in my office in 2 weeks for recheck.          ______________________________ Kristine Garbe. Ezzard Standing, M.D.     CEN/MEDQ  D:  04/17/2013  T:  04/17/2013  Job:  [id number]

## 2013-05-09 ENCOUNTER — Other Ambulatory Visit (HOSPITAL_BASED_OUTPATIENT_CLINIC_OR_DEPARTMENT_OTHER): Payer: 59 | Admitting: Lab

## 2013-05-09 ENCOUNTER — Ambulatory Visit (HOSPITAL_BASED_OUTPATIENT_CLINIC_OR_DEPARTMENT_OTHER): Payer: 59 | Admitting: Oncology

## 2013-05-09 ENCOUNTER — Telehealth: Payer: Self-pay | Admitting: Oncology

## 2013-05-09 VITALS — BP 104/69 | HR 81 | Temp 99.0°F | Resp 18 | Ht 68.0 in | Wt 158.7 lb

## 2013-05-09 DIAGNOSIS — Z923 Personal history of irradiation: Secondary | ICD-10-CM

## 2013-05-09 DIAGNOSIS — C109 Malignant neoplasm of oropharynx, unspecified: Secondary | ICD-10-CM

## 2013-05-09 DIAGNOSIS — K117 Disturbances of salivary secretion: Secondary | ICD-10-CM

## 2013-05-09 LAB — COMPREHENSIVE METABOLIC PANEL (CC13)
ALT: 17 U/L (ref 0–55)
AST: 20 U/L (ref 5–34)
Albumin: 4.1 g/dL (ref 3.5–5.0)
Alkaline Phosphatase: 98 U/L (ref 40–150)
Potassium: 3.7 mEq/L (ref 3.5–5.1)
Sodium: 142 mEq/L (ref 136–145)
Total Protein: 7.1 g/dL (ref 6.4–8.3)

## 2013-05-09 LAB — CBC WITH DIFFERENTIAL/PLATELET
BASO%: 0.4 % (ref 0.0–2.0)
EOS%: 2.2 % (ref 0.0–7.0)
Eosinophils Absolute: 0.1 10*3/uL (ref 0.0–0.5)
MCH: 30.7 pg (ref 27.2–33.4)
MCV: 88.9 fL (ref 79.3–98.0)
MONO%: 11.2 % (ref 0.0–14.0)
NEUT#: 4 10*3/uL (ref 1.5–6.5)
RBC: 4.25 10*6/uL (ref 4.20–5.82)
RDW: 13.2 % (ref 11.0–14.6)

## 2013-05-09 NOTE — Telephone Encounter (Signed)
Gave pt appt for lab and MD for July 2015 °

## 2013-05-09 NOTE — Patient Instructions (Addendum)
1.  History of head/neck cancer.  Continue to be in remission. 2.  Follow up:  With Dr. Bertis Ruddy in about 1 year.  Please continue to see Rad Onc and ENT between visits with [place].

## 2013-05-10 NOTE — Progress Notes (Signed)
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   CURRENT THERAPY: watchful observation.  INTERVAL HISTORY: Jason Luna 47 y.o. male returns for regular follow up by himself.  He reports stress (but denied depression) from family situation between his ex-wife and ex-girlfriend and children.  He denied smoking cigarettes, chewing tobacco, or drinking EtOH.  He denied neck swelling, dysphagia, odynophagia.  He is working full time without fatigue.   Patient denies fever, anorexia, weight loss, headache, visual changes, confusion, drenching night sweats, palpable lymph node swelling, mucositis, odynophagia, dysphagia, nausea vomiting, jaundice, chest pain, palpitation, shortness of breath, dyspnea on exertion, productive cough, gum bleeding, epistaxis, hematemesis, hemoptysis, abdominal pain, abdominal swelling, early satiety, melena, hematochezia, hematuria, skin rash, spontaneous bleeding, joint swelling, joint pain, heat or cold intolerance, bowel bladder incontinence, back pain, focal motor weakness, paresthesia.     Past Medical History  Diagnosis Date  . Allergy   . Headache(784.0)   . Gastritis   . Weight loss, abnormal 2012    PEG tube placement Oct 2012  . 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 History of radiation therapy 07/01/11 -08/19/11    right tonsil  . Migraines   . Nasal septal perforation     hx of chronic  . Anxiety and depression   . History of throat cancer 07/05/2012    He's had radiation and chemotherapies   . Acute appendicitis 07/05/2012  . Hx of migraines 07/05/2012  . 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 GERD (gastroesophageal reflux disease)      Past Surgical History  Procedure Laterality Date  . Nasal sinus surgery    . Peg tube placement  08/09/11 at [facility]    peg out now-2014  . Laparoscopic appendectomy  07/05/2012    Procedure: APPENDECTOMY LAPAROSCOPIC;  Surgeon: Clovis Pu. Cornett, MD;  Location: [facility];  Service: General;  Laterality: N/A;  . Peg tube removal    . Esophageal dilation  09/11/2012    Procedure: ESOPHAGEAL DILATION;  Surgeon: Drema Halon, MD;  Location: [facility];  Service: ENT;  Laterality: Bilateral;  ESOPHAGOSCOPY WITH SAVORY DILATORS   . Esophageal dilation N/A 04/17/2013    Procedure: ESOPHAGEAL DILATION;  Surgeon: Drema Halon, MD;  Location: [facility];  Service: ENT;  Laterality: N/A;    Current Outpatient Prescriptions  Medication Sig Dispense Refill  . acetaminophen (TYLENOL) 325 MG tablet Take 2 tablets (650 mg total) by mouth every 6 (six) hours as needed (or Temp > 100).      . cevimeline (EVOXAC) 30 MG capsule Take 30 mg by mouth 3 (three) times daily.      . divalproex (DEPAKOTE) 250 MG DR tablet Take 250 mg by mouth daily. For migraine      . divalproex (DEPAKOTE) 250 MG DR tablet take 1 tablet by mouth once daily  100 tablet  3  . EPINEPHrine (EPIPEN) 0.3 mg/0.3 mL DEVI Inject 0.3 mLs (0.3 mg total) into the muscle once.  2 Device  3  . LORazepam (ATIVAN) 1 MG tablet Take 1 tablet (1 mg total) by mouth 2 (two) times daily as needed for anxiety.  60 tablet  5  .  omeprazole (PRILOSEC) 20 MG capsule Take 1 capsule (20 mg total) by mouth 2 (two) times daily.  60 capsule  1  . rizatriptan (MAXALT) 10 MG tablet take 1 tablet by mouth if needed for migraines  (MAY REPEAT IN 2 HOURS IF NEEDED)  10 tablet  5   No current facility-administered medications for this visit.    ALLERGIES:  is allergic to naproxen sodium; bee venom; and zofran.  REVIEW OF SYSTEMS:  The rest of the 14-point review of system was negative.   Filed Vitals:   05/09/13 0909   BP: 104/69  Pulse: 81  Temp: 99 F (37.2 C)  Resp: 18   Wt Readings from Last 3 Encounters:  05/09/13 158 lb 11.2 oz (71.986 kg)  04/17/13 164 lb (74.39 kg)  04/17/13 164 lb (74.39 kg)   ECOG Performance status: 0  PHYSICAL EXAMINATION:   General:  well-nourished man, in no acute distress.  Eyes:  no scleral icterus.  ENT:  There were no oropharyngeal lesions on my unaided exam.  Neck was without thyromegaly.  Lymphatics:  Negative cervical, supraclavicular or axillary adenopathy.  Respiratory: lungs were clear bilaterally without wheezing or crackles.  Cardiovascular:  Regular rate and rhythm, S1/S2, without murmur, rub or gallop.  There was no pedal edema.  GI:  abdomen was soft, flat, nontender, nondistended, without organomegaly.  Muscoloskeletal:  no spinal tenderness of palpation of vertebral spine.  Skin exam was without echymosis, petichae.  Neuro exam was nonfocal.  Patient was able to get on and off exam table without assistance.  Gait was normal.  Patient was alert and oriented.  Attention was good.   Language was appropriate.  Mood was normal without depression.  Speech was not pressured.  Thought content was not tangential.      LABORATORY/RADIOLOGY DATA:  Lab Results  Component Value Date   WBC 5.4 05/09/2013   HGB 13.0 05/09/2013   HCT 37.8* 05/09/2013   PLT 191 05/09/2013   GLUCOSE 96 05/09/2013   CHOL 169 08/11/2010   TRIG 209.0* 08/11/2010   HDL 31.90* 08/11/2010   LDLDIRECT 103.0 08/11/2010   ALKPHOS 98 05/09/2013   ALT 17 05/09/2013   AST 20 05/09/2013   NA 142 05/09/2013   K 3.7 05/09/2013   CL 98 11/08/2012   CREATININE 1.0 05/09/2013   BUN 20.5 05/09/2013   CO2 30* 05/09/2013   INR 0.90 08/09/2011     ASSESSMENT AND PLAN:    1. Oropharynx squamous cell carcinoma:  Continue to be in remission.  He sees ENT at least twice a year.  There is no indication for routine surveillance CT scan without symptoms.  I again stressed total abstention from smoking, chewing tobacco, or  drinking EtOH.  2. 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  3. Anxiety/Depression: he is on amitriptyline and Depakote with his primary care physician. 4. Follow up:  In about 1 year.     I informed Jason Luna that I am leaving the practice.  [facility] will arrange for him to see another provider when he returns.     The length of time of the face-to-face encounter was 10 minutes. More than 50% of time was spent counseling and coordination of care.     Huan T. Gaylyn Rong, M.D.

## 2013-05-17 ENCOUNTER — Telehealth: Payer: Self-pay | Admitting: Oncology

## 2013-05-17 IMAGING — CT NM PET TUM IMG INITIAL (PI) SKULL BASE T - THIGH
6 series · 25 of 25 positions shown · non-contrast
Comparison: None.

CLINICAL DATA: Initial treatment strategy for squamous cell
carcinoma in right tonsil and along the right neck.

NUCLEAR MEDICINE PET CT INITIAL (PI) SKULL BASE TO THIGH
TECHNIQUE: 18.2 mCi F-18 FDG was injected intravenously via the
right antecubital fossa.  Full-ring PET imaging was performed from
the skull base through the mid-thighs 60  minutes after injection.
CT data was obtained and used for attenuation correction and
anatomic localization only.  (This was not acquired as a diagnostic
CT examination.)
Fasting Blood Glucose:  98

[Series 1: pet ac · axial · 3.3mm · 4.69mm/px · z∈[-336,-42]mm · 5 of 91 slices shown]
[im 1/91]
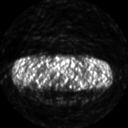
[im 23/91]
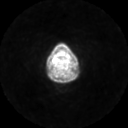
[im 46/91]
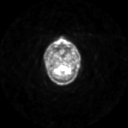
[im 68/91]
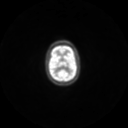
[im 91/91]
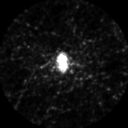

[Series 2: pet nac · axial · 3.3mm · 4.69mm/px · z∈[-336,-42]mm · 5 of 91 slices shown]
[im 1/91]
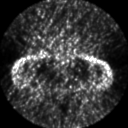
[im 23/91]
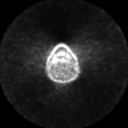
[im 46/91]
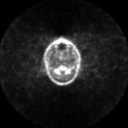
[im 68/91]
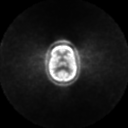
[im 91/91]
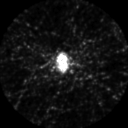

[Series 2: ct neck · axial · 3.8mm · 0.98mm/px · z∈[-336,-42]mm · 5 of 91 slices shown]
[im 1/91]
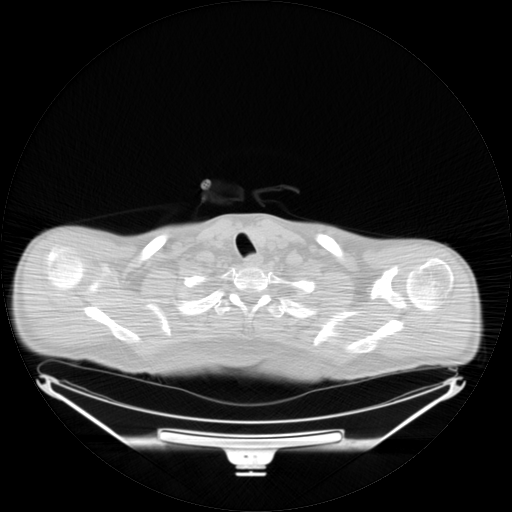
[im 23/91]
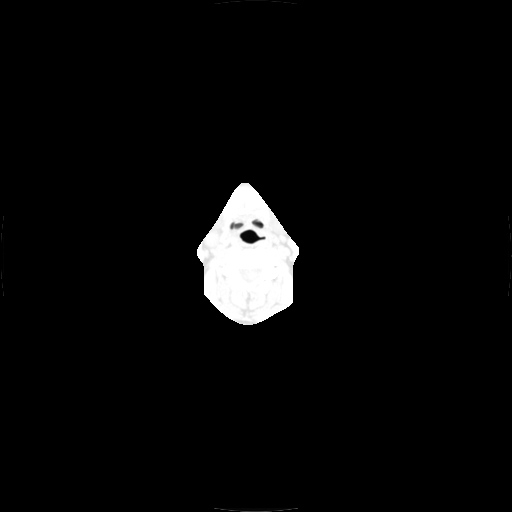
[im 46/91]
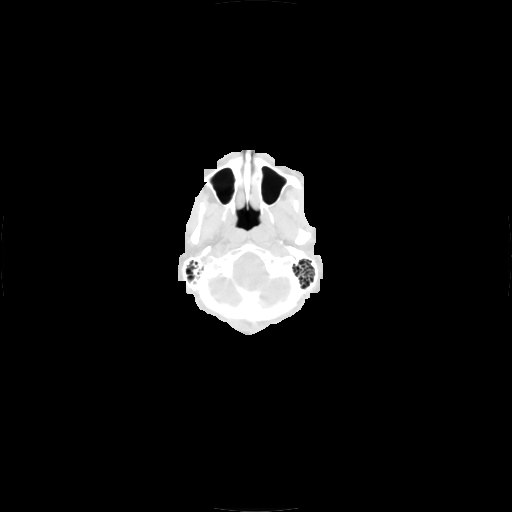
[im 68/91  brain]
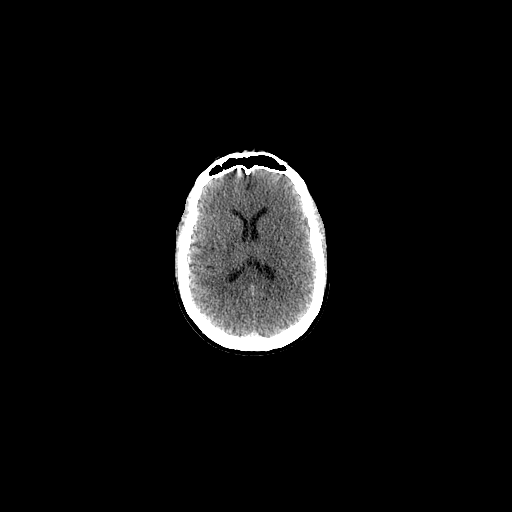
[im 91/91]
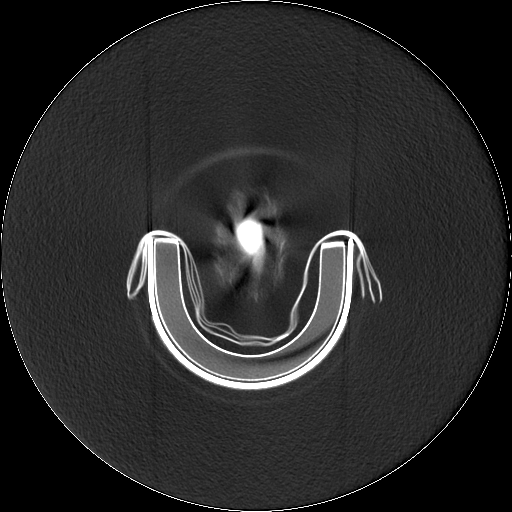

[Series 123: mip · coronal · 3.3mm · 4.69mm/px · 2 of 30 slices shown]
[im 1/30]
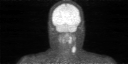
[im 30/30]
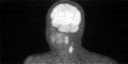

[Series 151: reformatted · axial · 3.3mm · 3.91mm/px · z∈[-336,-42]mm · 5 of 90 slices shown (1 of 2)]
[im 1/90]
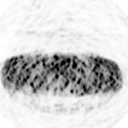
[im 23/90]
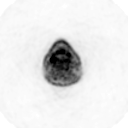
[im 45/90]
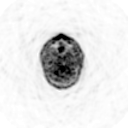
[im 67/90]
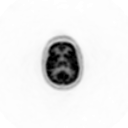
[im 90/90]
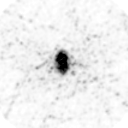

[Series 153: reformatted · coronal · 4.7mm · 4.69mm/px · 3 of 57 slices shown (2 of 2)]
[im 1/57]
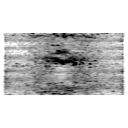
[im 29/57]
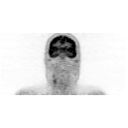
[im 57/57]
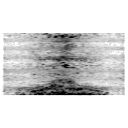

[25 of 25 positions shown; findings below may reference images not displayed]

FINDINGS: There is abnormal hypermetabolism in the right tonsillar
region, with an SUV max of 10.4 (fused image 55).  A right level II
lymph node measures 2.3 x 1.9 cm, with an S U V max of 12.2 (fused
image 72).  No additional areas of abnormal hypermetabolism in the
neck, chest, abdomen or pelvis.

CT images were performed for attenuation correction.  No
pericardial or pleural effusion.  No acute findings in the abdomen
or pelvis.
IMPRESSION: Malignant right tonsillar lesion and metastatic right level II
lymph node.

## 2013-05-17 NOTE — Telephone Encounter (Signed)
Mr. Malerba should see Dr. Narda Bonds if his sore throat does not improve the near future.

## 2013-05-17 NOTE — Telephone Encounter (Signed)
Received a call from Phylis Bougie.  He stated that he has a sore throat that started today.  He has not had a sore throat since he was treated for cancer in 2012.  He reports that it is swollen.  He also has a headache.  When he lays down and starts to get up, he gets a sharp pain in the left side of his head.  He has taken 2 ibuprofen without any relief.  He states that his son was sick on Sunday with a cold.  He was wondering if he needs to be seen by Dr. Dayton Scrape.  Advised patient that I would ask Dr. Dayton Scrape and that in the mean time, if he starts to have trouble breathing or if his throat is more swollen, to go to urgent care or the ER.

## 2013-05-18 ENCOUNTER — Telehealth: Payer: Self-pay | Admitting: *Deleted

## 2013-05-18 NOTE — Telephone Encounter (Signed)
Received call from pt re: sore throat. Per Dr Rennie Plowman telephone note in reply to Rushie Goltz RN's note dated 05/17/13, informed pt Dr Dayton Scrape stated, "[name] should see Dr. Narda Bonds if his sore throat does not improve the near future."  Gave pt Dr Allene Pyo phone number. Pt verbalized understanding.

## 2013-07-12 IMAGING — XA IR PERC PLACEMENT GASTROSTOMY
1 series · 7 of 7 positions shown · non-contrast
Comparison: None.

CLINICAL DATA: Tonsillar carcinoma, undergoing radiation therapy.
Enteral feeding support as requested.

PERCUTANEOUS GASTROSTOMY PLACEMENT UNDER FLUOROSCOPY
TECHNIQUE: The procedure, risks, benefits, and alternatives were
explained to the patient.  Questions regarding the procedure were
encouraged and answered.  The patient understands and consents to
the procedure.

[Series 1: run · 7 of 7 slices shown]
[im 1/7]
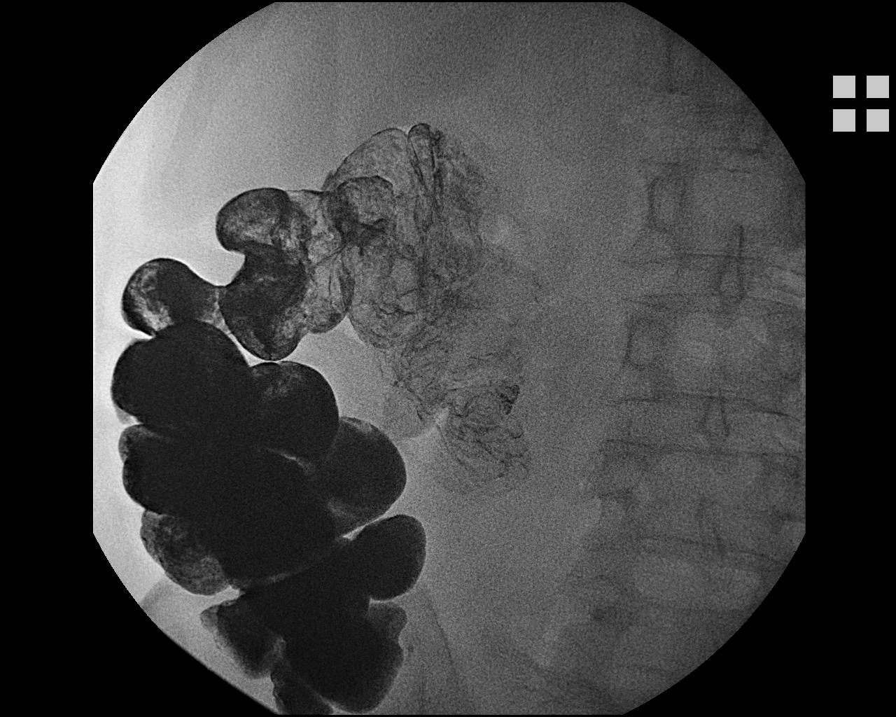
[im 2/7]
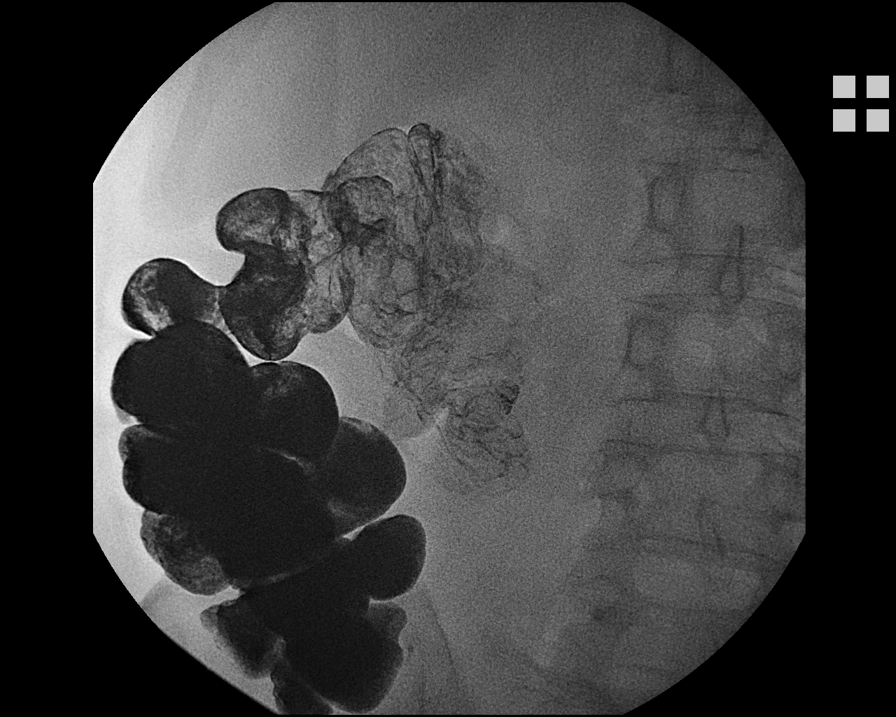
[im 3/7]
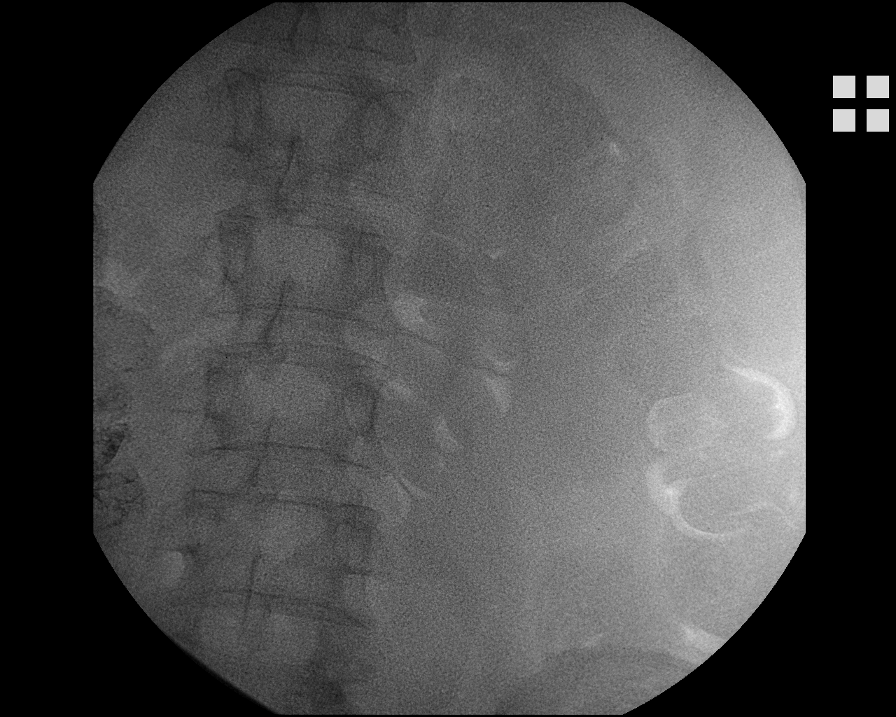
[im 4/7]
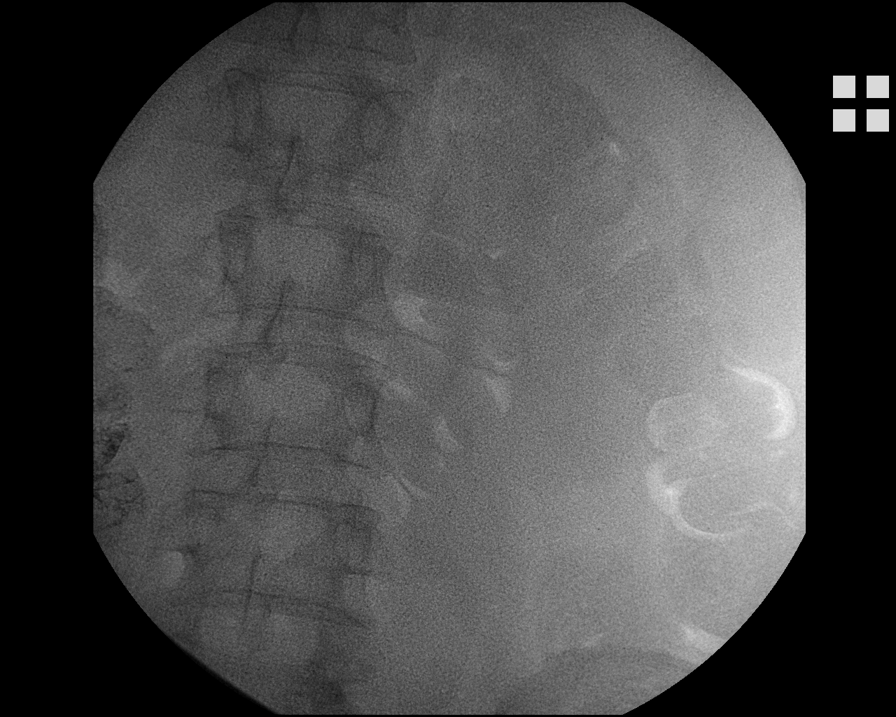
[im 5/7]
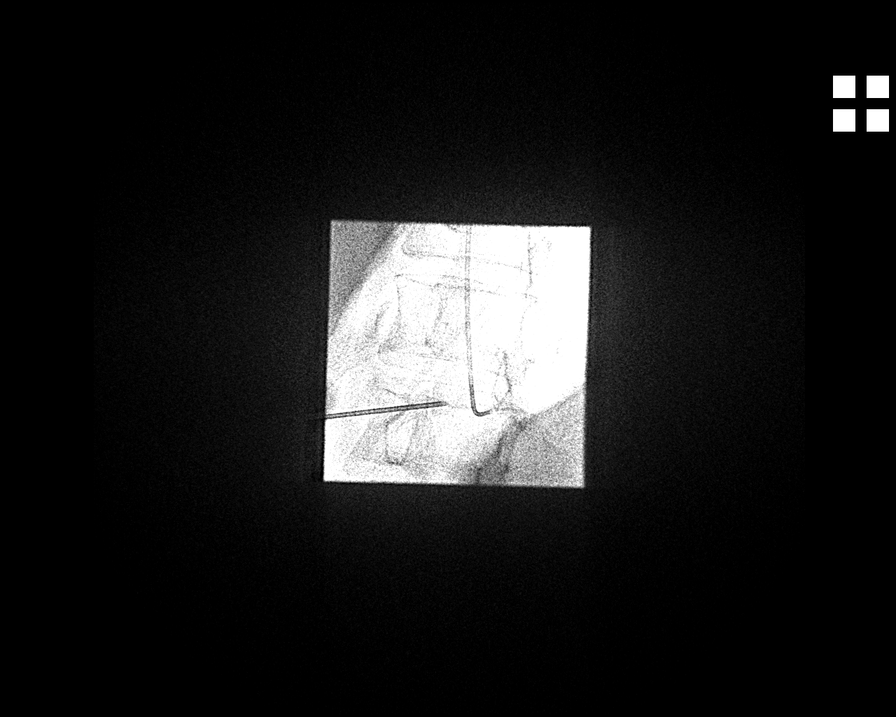
[im 6/7]
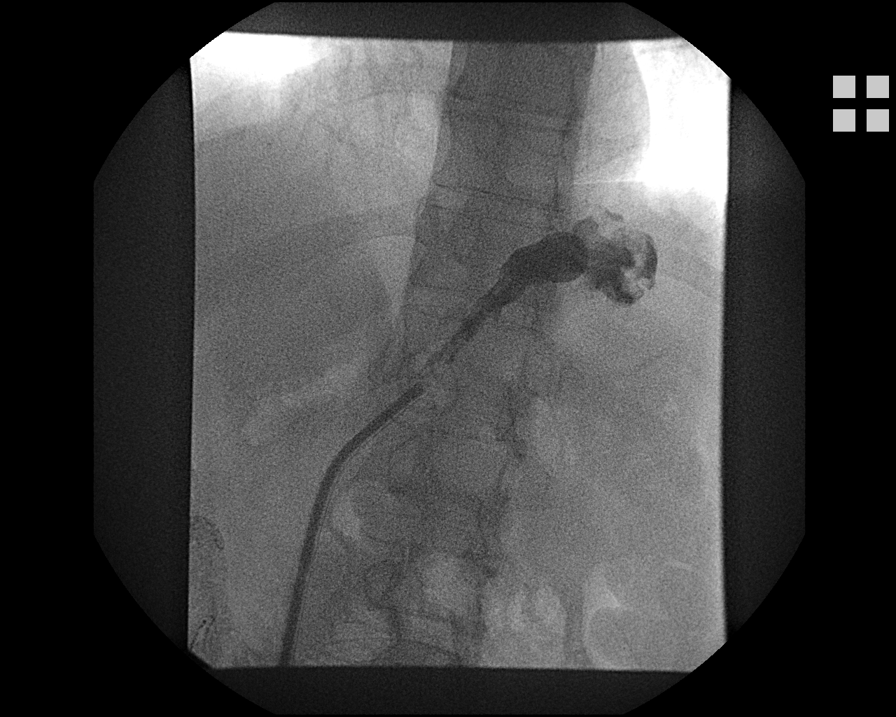
[im 7/7]
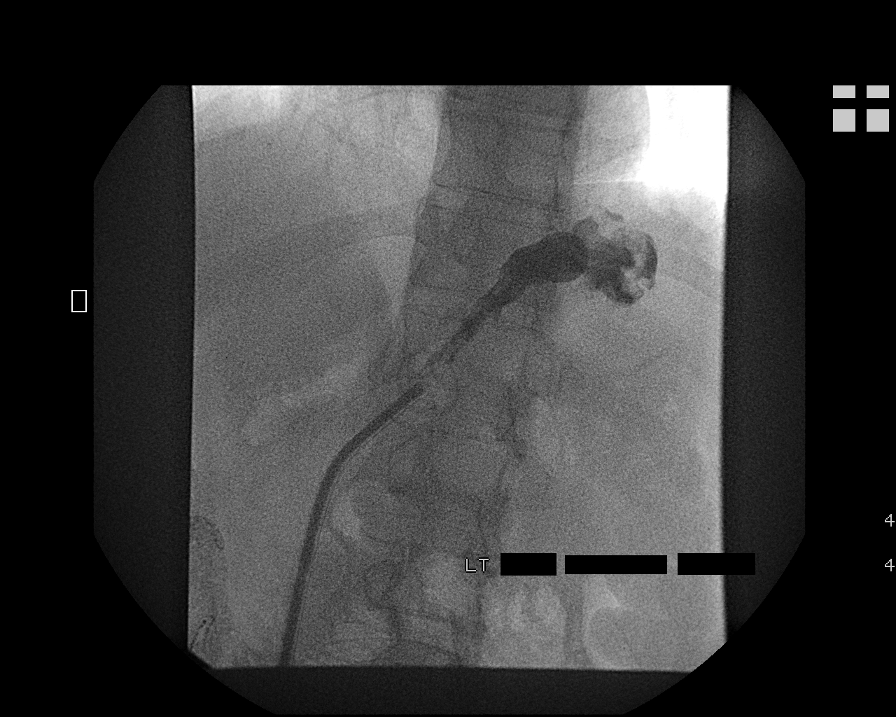

[7 of 7 positions shown; findings below may reference images not displayed]

As antibiotic prophylaxis, cefazolin was ordered pre-procedure and
administered intravenously within 1 hour of incision.

Progression of previously administered oral barium into the colon
was confirmed fluoroscopically. A 5 French angiographic catheter
was placed as orogastric tube. The upper abdomen was prepped with
Betadine, draped in usual sterile fashion, and infiltrated locally
with 1% lidocaine.

Intravenous Fentanyl and Versed were administered as conscious
sedation during continuous cardiorespiratory monitoring by the
radiology RN, with a total moderate sedation time of 15 minutes.

Stomach was insufflated using air through the orogastric tube. An
18 French sheath needle was advanced percutaneously into the
gastric lumen under fluoroscopy. Gas could be aspirated and a small
contrast injection confirmed intraluminal spread. The sheath was
exchanged over a guidewire for a 9 French vascular sheath, through
which the snare device was advanced and used to snare a guidewire
passed through the orogastric tube. This was withdrawn, and the
snare attached to the 20 French pull-through gastrostomy tube,
which was advanced antegrade, positioned with the internal bumper
securing the anterior gastric wall to the anterior abdominal wall.
Small contrast injection confirms appropriate positioning. The
external bumper was applied and the catheter was flushed. No
immediate complication.
IMPRESSION: 1. Technically successful 20 French pull-through gastrostomy
placement under fluoroscopy.

PERC PLACEMENT GASTROSTOMY

Fluoroscopy Time:

Contrast:
FINDINGS: 
IMPRESSION:

## 2013-07-14 IMAGING — CR DG CHEST 2V
2 series · 2 of 2 positions shown · non-contrast
Comparison: None.

CLINICAL DATA: Fever

CHEST - 2 VIEW

[w chest pa]
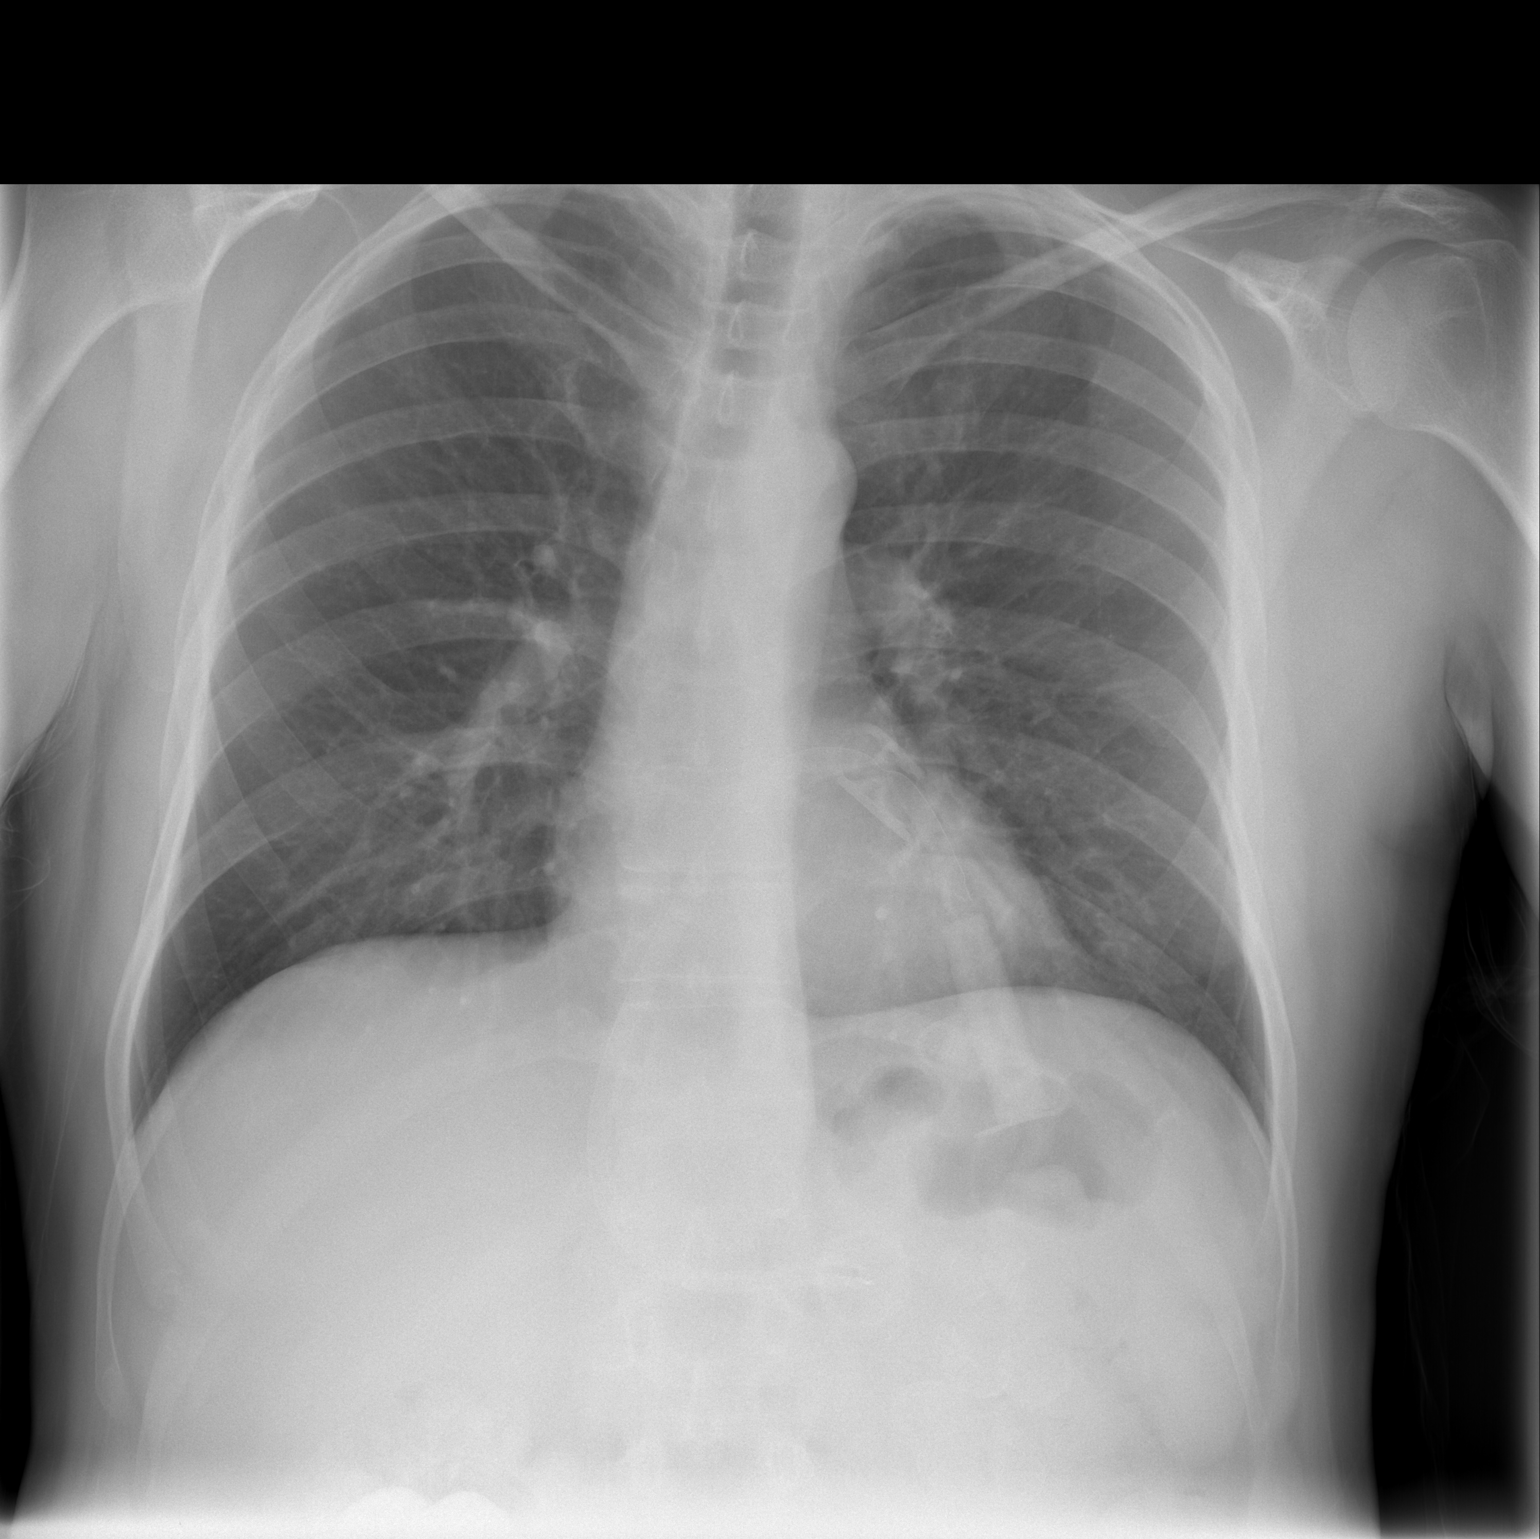

[w chest lat]
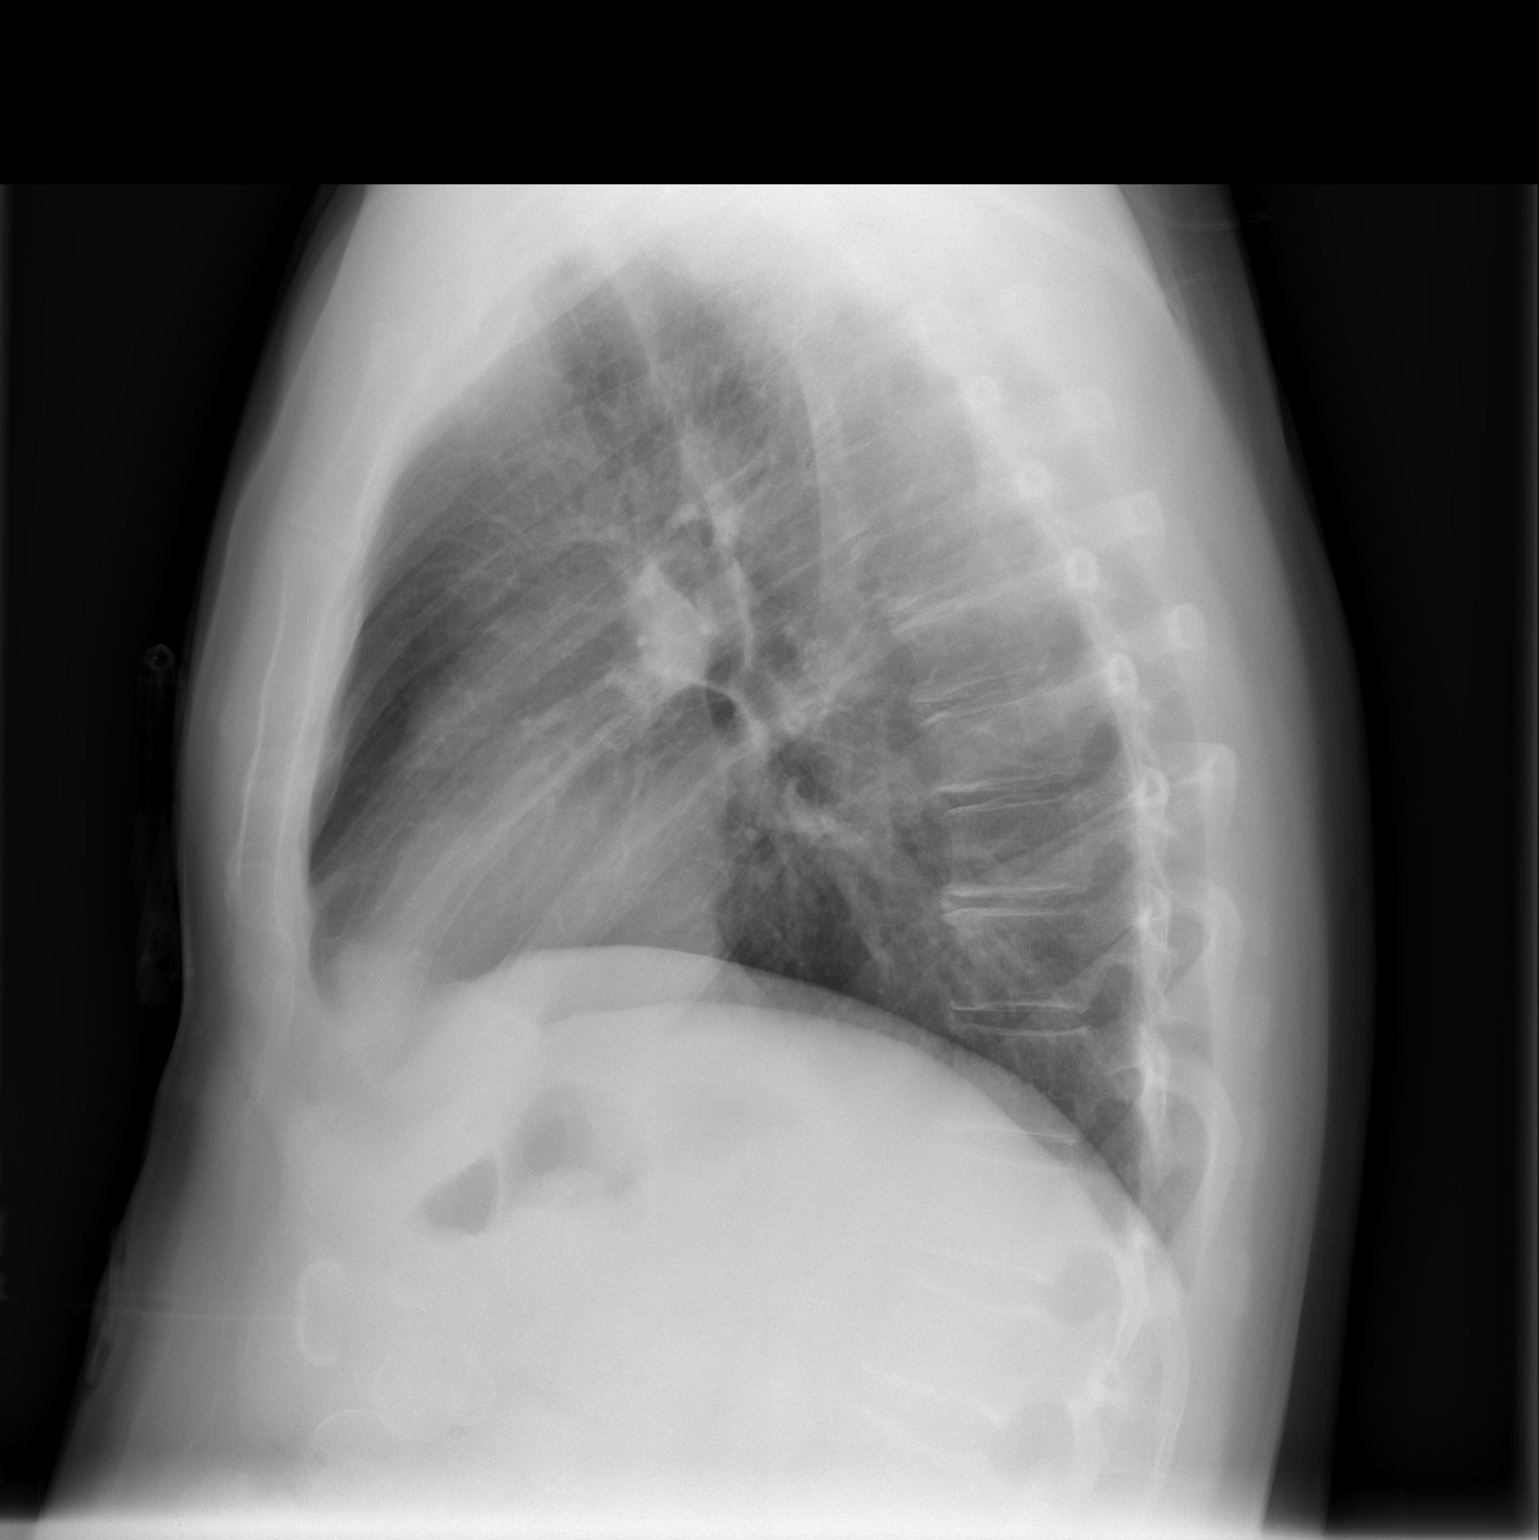

[2 of 2 positions shown; findings below may reference images not displayed]

FINDINGS: The lungs are clear without focal infiltrate, edema,
pneumothorax or pleural effusion. The cardiopericardial silhouette
is within normal limits for size. Imaged bony structures of the
thorax are intact.  Gastrostomy tube overlies the left upper
quadrant
IMPRESSION: Normal chest radiograph.

## 2013-07-24 ENCOUNTER — Other Ambulatory Visit: Payer: Self-pay | Admitting: Family Medicine

## 2013-08-03 ENCOUNTER — Encounter: Payer: Self-pay | Admitting: Radiation Oncology

## 2013-08-07 ENCOUNTER — Encounter: Payer: Self-pay | Admitting: Radiation Oncology

## 2013-08-07 ENCOUNTER — Ambulatory Visit
Admission: RE | Admit: 2013-08-07 | Discharge: 2013-08-07 | Disposition: A | Discharge status: Home / Self Care | Payer: 59 | Source: Ambulatory Visit | Attending: Radiation Oncology | Admitting: Radiation Oncology

## 2013-08-07 VITALS — BP 95/53 | HR 68 | Temp 98.6°F | Resp 20 | Wt 166.0 lb

## 2013-08-07 DIAGNOSIS — C109 Malignant neoplasm of oropharynx, unspecified: Secondary | ICD-10-CM

## 2013-08-07 NOTE — Progress Notes (Signed)
Pt denies pain, fatigue, loss of appetite, difficulty eating. He does have to drink liquids when eating, occasionally has difficulty w/chicken and meats. Dryness of mouth varies.

## 2013-08-07 NOTE — Progress Notes (Signed)
CC: Dr. Narda Bonds  Followup note: Jason Luna returns today approximately 2 years following completion of chemoradiation in the management of his T2 N1 squamous cell carcinoma the right tonsil. He underwent pharyngeal dilatation by Dr. Ezzard Standing on 04/17/2013 with improvement of his dysphagia. He still has some dryness of his mouth and throat making it difficult to eat dry foods. He continues with Evoxac for her xerostomia. In mid July he had a sore throat/pharyngitis which improved with antibiotics. He is without complaints today. He maintains his dental followup. He saw Dr. Gaylyn Rong 2 months ago before his departure. He has not been scheduled for medical oncology followup.  Physical examination: Alert and oriented. Filed Vitals:   08/07/13 1040  BP: 95/53  Pulse: 68  Temp: 98.6 F (37 C)  Resp: 20   Nodes: There is no palpable lymphadenopathy in the neck. Oral cavity and oropharynx unremarkable to inspection. There is mild to moderate xerostomia. No visible or palpable evidence for recurrent disease along his right tonsil. Indirect mirror examination confirmatory.  Plan: Followup with Dr. Ezzard Standing in 3 months in followup with me in 6 months.

## 2013-10-19 ENCOUNTER — Telehealth: Payer: Self-pay | Admitting: Family Medicine

## 2013-10-19 DIAGNOSIS — F5101 Primary insomnia: Secondary | ICD-10-CM

## 2013-10-19 MED ORDER — DIVALPROEX SODIUM 250 MG PO DR TAB: DELAYED_RELEASE_TABLET | ORAL | Status: DC

## 2013-10-19 MED ORDER — LORAZEPAM 1 MG PO TABS: 1.0000 mg | ORAL_TABLET | Freq: Two times a day (BID) | ORAL | Status: DC | PRN

## 2013-10-19 NOTE — Telephone Encounter (Signed)
Pt request refill of divalproex (DEPAKOTE) 250 MG DR tablet LORazepam (ATIVAN) 1 MG tablet Pharm: [facility]/ [address] Pt has made appt for Jan for a fu on meds.

## 2013-11-13 ENCOUNTER — Encounter: Payer: Self-pay | Admitting: Family Medicine

## 2013-11-13 ENCOUNTER — Ambulatory Visit (INDEPENDENT_AMBULATORY_CARE_PROVIDER_SITE_OTHER): Payer: 59 | Admitting: Family Medicine

## 2013-11-13 VITALS — BP 110/70 | Temp 98.6°F | Ht 69.0 in | Wt 167.0 lb

## 2013-11-13 DIAGNOSIS — Z23 Encounter for immunization: Secondary | ICD-10-CM

## 2013-11-13 DIAGNOSIS — M545 Low back pain, unspecified: Secondary | ICD-10-CM

## 2013-11-13 DIAGNOSIS — R109 Unspecified abdominal pain: Secondary | ICD-10-CM

## 2013-11-13 DIAGNOSIS — R1030 Lower abdominal pain, unspecified: Secondary | ICD-10-CM

## 2013-11-13 DIAGNOSIS — F5101 Primary insomnia: Secondary | ICD-10-CM

## 2013-11-13 DIAGNOSIS — G43909 Migraine, unspecified, not intractable, without status migrainosus: Secondary | ICD-10-CM

## 2013-11-13 DIAGNOSIS — G4700 Insomnia, unspecified: Secondary | ICD-10-CM

## 2013-11-13 LAB — POCT URINALYSIS DIPSTICK
BILIRUBIN UA: NEGATIVE
GLUCOSE UA: NEGATIVE
Ketones, UA: NEGATIVE
LEUKOCYTES UA: NEGATIVE
NITRITE UA: NEGATIVE
Protein, UA: NEGATIVE
RBC UA: NEGATIVE
Spec Grav, UA: 1.02
UROBILINOGEN UA: 0.2
pH, UA: 6.5

## 2013-11-13 MED ORDER — DIVALPROEX SODIUM 250 MG PO DR TAB: DELAYED_RELEASE_TABLET | ORAL | Status: DC

## 2013-11-13 MED ORDER — EPINEPHRINE 0.3 MG/0.3ML IJ SOAJ: 0.3000 mg | Freq: Once | INTRAMUSCULAR | Status: AC

## 2013-11-13 MED ORDER — PROMETHAZINE HCL 25 MG PO TABS: 25.0000 mg | ORAL_TABLET | Freq: Three times a day (TID) | ORAL | Status: DC | PRN

## 2013-11-13 MED ORDER — PROMETHAZINE HCL 12.5 MG RE SUPP: 12.5000 mg | Freq: Four times a day (QID) | RECTAL | Status: DC | PRN

## 2013-11-13 MED ORDER — ATENOLOL 25 MG PO TABS: 25.0000 mg | ORAL_TABLET | Freq: Every day | ORAL | Status: DC

## 2013-11-13 MED ORDER — LORAZEPAM 1 MG PO TABS: 1.0000 mg | ORAL_TABLET | Freq: Two times a day (BID) | ORAL | Status: DC | PRN

## 2013-11-13 MED ORDER — TRAMADOL HCL 50 MG PO TABS: 50.0000 mg | ORAL_TABLET | Freq: Three times a day (TID) | ORAL | Status: DC | PRN

## 2013-11-13 NOTE — Patient Instructions (Signed)
Begin the Tenormin 25 mg,,,,,,,,, one tablet at bedtime to help stop the migraine headaches  Finnegan tablets,,,,,,,,, or suppositories,,,,,,,, every 6-8 hours when necessary for nausea and vomiting  Tramadol..........Marland Kitchen 1/2-1 tablet when necessary for pain  Return when necessary  Also she wakes up in the morning with a dull headache take 600 mg of Motrin immediately

## 2013-11-13 NOTE — Progress Notes (Signed)
   Subjective:    Patient ID: Jason Luna, male    DOB: 1966-05-30, 48 y.o.   MRN: [id number]  HPI  Jason Luna is a 48 year old male who comes in today for followup of a couple issues  He states before the Christmas holiday had a severe migraine. He developed some nausea and vomiting. The he Maxalt didn't seem to help although he took it right away. He would like a refill on Phenergan and Vicodin to take when necessary. We also discussed prophylaxis. He's willing to try some beta blockers 25 mg of Tenormin daily. He is now having a dull headache which he is waking up with about 3 times per week.  He also needs refills on the defecate epinephrine and the Ativan for sleep.  He's had a dull ache in his back and hasn't felt well for couple weeks. No specific symptoms. No fever chills nausea vomiting or diarrhea. He says is having some urinary frequency but no dysuria no hematuria.  Review of Systems    review of systems otherwise negative Objective:   Physical Exam  Well-developed well-nourished male no acute distress vital signs stable he is afebrile urinalysis normal      Assessment & Plan:  Migraine headaches not at good control........ change therapy see orders  Sleep dysfunction continue Ativan  Refill other meds

## 2013-11-13 NOTE — Progress Notes (Signed)
Pre visit review using our clinic review tool, if applicable. No additional management support is needed unless otherwise documented below in the visit note. 

## 2013-11-21 ENCOUNTER — Telehealth: Payer: Self-pay | Admitting: Family Medicine

## 2013-11-21 NOTE — Telephone Encounter (Signed)
Patient Information:  Caller Name: Kazmir  Phone: [phone]  Patient: Jason Luna, Jason Luna  Gender: Male  DOB: [birth year]  Age: 48 Years  PCP: [facility])  Office Follow Up:  Does the office need to follow up with this patient?: No  Instructions For The Office: N/A   Symptoms  Reason For Call & Symptoms: Pt states he hit his head on a facet 11/19/13 and having headache, dizziness.  Reviewed Health History In EMR: Yes  Reviewed Medications In EMR: Yes  Reviewed Allergies In EMR: Yes  Reviewed Surgeries / Procedures: Yes  Date of Onset of Symptoms: 11/19/2013  Guideline(s) Used:  Head Injury  Disposition Per Guideline:   Home Care  Reason For Disposition Reached:   Minor head injury  Advice Given:  Reassurance - Direct Blow (Contusion, Bruise)  This sounds like a scalp injury rather than a brain injury or concussion. Treatment at home should be safe. A direct blow to your scalp can cause a contusion. Contusion is the medical term for bruise.  Symptoms are mild pain, swelling, and/or bruising. Sometimes there can also be mild dizziness and nausea.  Here is some care advice that should help.  Apply a Cold Pack:  Continue this for the first 48 hours after an injury.  This will help decrease pain and swelling.  Apply Heat to the Area:  Beginning 48 hours after an injury, apply a warm washcloth or heating pad for 10 minutes three times a day.  This will help increase blood flow and improve healing.  Expected Course:  Most head trauma only causes an injury to the scalp. Pain, swelling, and bruising usually start to get better 2 to 3 days after an injury.  Swelling most often is gone after 1 week.  Bruises fade away slowly over 1-2 weeks.  It may take 2 weeks for pain and tenderness of the injured area to go away.  Diet:   Clear fluids to drink at first, in case of vomiting. May resume a regular diet after 2 hours.  Call Back If:  Severe headache  Extremity  weakness or numbness occurs  Slurred speech or blurred vision occurs  Vomiting occurs  You become worse.  Patient Will Follow Care Advice:  YES

## 2013-12-14 ENCOUNTER — Telehealth: Payer: Self-pay | Admitting: *Deleted

## 2013-12-14 NOTE — Telephone Encounter (Signed)
Called patient to ask about coming in earlier than 6 months out - patient said that he wants to come in earlier than 6 months due to having a sore jaw.

## 2013-12-18 ENCOUNTER — Encounter: Payer: Self-pay | Admitting: Radiation Oncology

## 2013-12-18 ENCOUNTER — Ambulatory Visit
Admission: RE | Admit: 2013-12-18 | Discharge: 2013-12-18 | Disposition: A | Discharge status: Home / Self Care | Payer: 59 | Source: Ambulatory Visit | Attending: Radiation Oncology | Admitting: Radiation Oncology

## 2013-12-18 VITALS — BP 127/70 | HR 79 | Temp 98.9°F | Resp 20 | Wt 169.0 lb

## 2013-12-18 DIAGNOSIS — C109 Malignant neoplasm of oropharynx, unspecified: Secondary | ICD-10-CM

## 2013-12-18 NOTE — Progress Notes (Signed)
CC: Dr. 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 He underwent his second pharyngeal dilatation back in June of 2014 with improvement of his dysphagia. He believes that his dysphagia has slightly worsened since then. He comes in a little bit early for his scheduled followup visit because of a recent history of right "jaw pain ". This began soon after he increased frequency of his trismus exercises, and his pain is now much improved. He maintains his dental followup with Dr. Britta Mccreedy. He sees Dr. Lucia Gaskins again for a followup visit this March or April. He will see Dr. Heath Lark this summer. That The physical examination: Alert and oriented. Filed Vitals:   12/18/13 0956  BP: 127/70  Pulse: 79  Temp: 98.9 F (37.2 C)  Resp: 20   Nodes: There is no palpable lymphadenopathy in the neck. Oral cavity: There is no significant trismus. There is mild to moderate xerostomia. On inspection of the oropharynx there is no evidence for recurrent disease. On indirect mirror examination and palpation there is no evidence for recurrent disease along the right oropharynx.  Impression: Satisfactory progress. I suspect that his right jaw discomfort was secondary to fibrosis which is improved with his trismus exercises. If his dysphagia worsens he may need repeat dilatation.  Plan: Followup visit with Dr. Lucia Gaskins this spring, and I'll see him back for a followup visit in July. He'll see Dr. Heath Lark this summer as well.

## 2013-12-18 NOTE — Progress Notes (Signed)
Pt requested to be seen earlier than his 6 month FU due to right jaw soreness. Pt states he exercises his jaw often but had stopped due to developing soreness of his right jaw. He states he recently began exercising his jaw again, and it has "felt better". He denies swallowing or eating difficulties, fatigue, loss of appetite. Pt last saw Dr Lucia Gaskins Oct 2014, has FU in April 2015.

## 2014-01-03 ENCOUNTER — Other Ambulatory Visit: Payer: Self-pay | Admitting: Family Medicine

## 2014-01-17 ENCOUNTER — Encounter (HOSPITAL_BASED_OUTPATIENT_CLINIC_OR_DEPARTMENT_OTHER): Payer: Self-pay | Admitting: *Deleted

## 2014-01-17 ENCOUNTER — Encounter (HOSPITAL_BASED_OUTPATIENT_CLINIC_OR_DEPARTMENT_OTHER)
Admission: RE | Admit: 2014-01-17 | Discharge: 2014-01-17 | Disposition: A | Discharge status: Home / Self Care | Payer: 59 | Source: Ambulatory Visit | Attending: Otolaryngology | Admitting: Otolaryngology

## 2014-01-17 DIAGNOSIS — Z01812 Encounter for preprocedural laboratory examination: Secondary | ICD-10-CM | POA: Insufficient documentation

## 2014-01-17 DIAGNOSIS — Z01810 Encounter for preprocedural cardiovascular examination: Secondary | ICD-10-CM | POA: Insufficient documentation

## 2014-01-17 LAB — BASIC METABOLIC PANEL
BUN: 17 mg/dL (ref 6–23)
CHLORIDE: 95 meq/L — AB (ref 96–112)
CO2: 33 mEq/L — ABNORMAL HIGH (ref 19–32)
CREATININE: 0.96 mg/dL (ref 0.50–1.35)
Calcium: 9.9 mg/dL (ref 8.4–10.5)
GFR calc non Af Amer: >90 mL/min (ref >90)
Glucose, Bld: 73 mg/dL (ref 70–99)
Potassium: 3.7 mEq/L (ref 3.7–5.3)
Sodium: 139 mEq/L (ref 137–147)

## 2014-01-17 NOTE — Progress Notes (Signed)
To come in for ekg-bmet-has been her several times 

## 2014-01-21 NOTE — H&P (Signed)
PREOPERATIVE H&P  Chief Complaint: dysphagia  HPI: Jason Luna is a 48 y.o. male who presents for evaluation of trouble swallowing. He's over 2 years status post chemoradiation treatment for right tonsil HPV SCCa. He's done well with no evidence of recurrent disease but hs been having increasing difficulty swallowing. He's had previous dilation of the esophagus. Last time about 10 months ago. He's taken to the OR for repeat dilation.  Past Medical History  Diagnosis Date  . Allergy   . Headache(784.0)   . Gastritis   . Weight loss, abnormal 2012    PEG tube placement Oct 2012  . 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 History of radiation therapy 07/01/11 -08/19/11    right tonsil  . Migraines   . Nasal septal perforation     hx of chronic  . Anxiety and depression   . History of throat cancer 07/05/2012    He's had radiation and chemotherapies   . Acute appendicitis 07/05/2012  . Hx of migraines 07/05/2012  . 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 GERD (gastroesophageal reflux disease)    Past Surgical History  Procedure Laterality Date  . Nasal sinus surgery    . Peg tube placement  08/09/11 at [facility]    peg out now-2014  . Laparoscopic appendectomy  07/05/2012    Procedure: APPENDECTOMY LAPAROSCOPIC;  Surgeon: Joyice Faster. Cornett, MD;  Location: [facility];  Service: General;  Laterality: N/A;  . Peg tube removal    . Esophageal dilation  09/11/2012    Procedure: ESOPHAGEAL DILATION;  Surgeon: Rozetta Nunnery, MD;  Location: [facility];  Service: ENT;  Laterality: Bilateral;  ESOPHAGOSCOPY WITH SAVORY DILATORS   . Esophageal dilation N/A 04/17/2013    Procedure: ESOPHAGEAL DILATION;  Surgeon: Rozetta Nunnery, MD;  Location: [place];  Service: ENT;  Laterality: N/A;   History   Social History  . Marital Status: Divorced    Spouse Name: N/A    Number of Children: N/A  . Years of Education: N/A    Social History Main Topics  . Smoking status: Never Smoker   . Smokeless tobacco: None  . Alcohol Use: Yes     Comment: 1 - 2 ALCOHOLIC BEVERAGES WEEKLY  . Drug Use: Yes     Comment: COCAINE USE OVER 10 YRS AGO  . Sexual Activity: None   Other Topics Concern  . None   Social History Narrative  . None   Family History  Problem Relation Age of Onset  . GI problems Mother     crohns  . Asthma Other   . Cancer Other     prostate,skin  . Leukemia Mother     ALL age 40  . Cancer Father     pancreatic   Allergies  Allergen Reactions  . Naproxen Sodium Anaphylaxis  . Bee Venom Swelling  . Zofran [Ondansetron Hcl]    Prior to Admission medications   Medication Sig Start Date End Date Taking? Authorizing Provider  atenolol (TENORMIN) 25 MG tablet Take 1 tablet (25 mg total) by mouth daily. 11/13/13   Dorena Cookey, MD  cevimeline [facility]) 30 MG capsule Take 30 mg by mouth 3 (three) times daily.    Historical Provider, MD  divalproex (DEPAKOTE) 250 MG DR tablet take 1 tablet by mouth once daily 11/13/13   Dorena Cookey, MD  EPINEPHrine (EPIPEN) 0.3 mg/0.3 mL SOAJ injection Inject 0.3 mLs (0.3  mg total) into the muscle once. 11/13/13   Dorena Cookey, MD  LORazepam (ATIVAN) 1 MG tablet take 1 tablet by mouth if needed for anxiety 01/03/14   Dorena Cookey, MD  omeprazole (PRILOSEC) 20 MG capsule Take 1 capsule (20 mg total) by mouth 2 (two) times daily. 09/11/12   Rozetta Nunnery, MD  promethazine (PHENERGAN) 12.5 MG suppository Place 1 suppository (12.5 mg total) rectally every 6 (six) hours as needed for nausea or vomiting. 11/13/13   Dorena Cookey, MD  promethazine (PHENERGAN) 25 MG tablet Take 1 tablet (25 mg total) by mouth every 8 (eight) hours as needed for nausea or vomiting. 11/13/13   Dorena Cookey, MD  rizatriptan (MAXALT) 10 MG tablet take 1 tablet by mouth if needed for MIGRAINES may repeat after 2 hours if needed 07/24/13   Dorena Cookey, MD     Positive ROS:  difficulty swallowing  All other systems have been reviewed and were otherwise negative with the exception of those mentioned in the HPI and as above.  Physical Exam: There were no vitals filed for this visit.  General: Alert, no acute distress Oral: Normal oral mucosa  Nasal: Clear nasal passages Neck: No palpable adenopathy or thyroid nodules Ear: Ear canal is clear with normal appearing TMs Cardiovascular: Regular rate and rhythm, no murmur.  Respiratory: Clear to auscultation Neurologic: Alert and oriented x 3   Assessment/Plan: ESOPHAGEAL STENOSIS Plan for Procedure(s): ESOPHAGEAL DILATION WITH THE SAVORY DILATORS   Melony Overly, MD 01/21/2014 3:17 PM

## 2014-01-22 ENCOUNTER — Encounter (HOSPITAL_BASED_OUTPATIENT_CLINIC_OR_DEPARTMENT_OTHER): Payer: 59 | Admitting: Anesthesiology

## 2014-01-22 ENCOUNTER — Encounter (HOSPITAL_BASED_OUTPATIENT_CLINIC_OR_DEPARTMENT_OTHER): Payer: Self-pay | Admitting: *Deleted

## 2014-01-22 ENCOUNTER — Ambulatory Visit (HOSPITAL_BASED_OUTPATIENT_CLINIC_OR_DEPARTMENT_OTHER)
Admission: RE | Admit: 2014-01-22 | Discharge: 2014-01-22 | Disposition: A | Discharge status: Home / Self Care | Payer: 59 | Source: Ambulatory Visit | Attending: Otolaryngology | Admitting: Otolaryngology

## 2014-01-22 ENCOUNTER — Ambulatory Visit (HOSPITAL_BASED_OUTPATIENT_CLINIC_OR_DEPARTMENT_OTHER): Payer: 59 | Admitting: Anesthesiology

## 2014-01-22 ENCOUNTER — Encounter (HOSPITAL_BASED_OUTPATIENT_CLINIC_OR_DEPARTMENT_OTHER)
Admission: RE | Disposition: A | Discharge status: Home / Self Care | Payer: Self-pay | Source: Ambulatory Visit | Attending: Otolaryngology

## 2014-01-22 DIAGNOSIS — Z85819 Personal history of malignant neoplasm of unspecified site of lip, oral cavity, and pharynx: Secondary | ICD-10-CM | POA: Insufficient documentation

## 2014-01-22 DIAGNOSIS — K219 Gastro-esophageal reflux disease without esophagitis: Secondary | ICD-10-CM | POA: Insufficient documentation

## 2014-01-22 DIAGNOSIS — F3289 Other specified depressive episodes: Secondary | ICD-10-CM | POA: Insufficient documentation

## 2014-01-22 DIAGNOSIS — G43909 Migraine, unspecified, not intractable, without status migrainosus: Secondary | ICD-10-CM | POA: Insufficient documentation

## 2014-01-22 DIAGNOSIS — Z923 Personal history of irradiation: Secondary | ICD-10-CM | POA: Insufficient documentation

## 2014-01-22 DIAGNOSIS — F411 Generalized anxiety disorder: Secondary | ICD-10-CM | POA: Insufficient documentation

## 2014-01-22 DIAGNOSIS — Z9221 Personal history of antineoplastic chemotherapy: Secondary | ICD-10-CM | POA: Insufficient documentation

## 2014-01-22 DIAGNOSIS — F329 Major depressive disorder, single episode, unspecified: Secondary | ICD-10-CM | POA: Insufficient documentation

## 2014-01-22 DIAGNOSIS — K222 Esophageal obstruction: Secondary | ICD-10-CM | POA: Insufficient documentation

## 2014-01-22 HISTORY — PX: ESOPHAGEAL DILATION: SHX303

## 2014-01-22 LAB — POCT HEMOGLOBIN-HEMACUE: HEMOGLOBIN: 14.1 g/dL (ref 13.0–17.0)

## 2014-01-22 SURGERY — DILATION, ESOPHAGUS
Anesthesia: General | Site: Throat

## 2014-01-22 MED ORDER — LACTATED RINGERS IV SOLN
INTRAVENOUS | Status: DC
  Administered 2014-01-22: 07:00:00 via INTRAVENOUS

## 2014-01-22 MED ORDER — HYDROMORPHONE HCL PF 1 MG/ML IJ SOLN: 0.2500 mg | INTRAMUSCULAR | Status: DC | PRN

## 2014-01-22 MED ORDER — CEFAZOLIN SODIUM-DEXTROSE 2-3 GM-% IV SOLR
INTRAVENOUS | Status: DC | PRN
  Administered 2014-01-22: 2 g via INTRAVENOUS

## 2014-01-22 MED ORDER — LIDOCAINE HCL (CARDIAC) 20 MG/ML IV SOLN
INTRAVENOUS | Status: DC | PRN
  Administered 2014-01-22: 100 mg via INTRAVENOUS

## 2014-01-22 MED ORDER — PROPOFOL 10 MG/ML IV BOLUS
INTRAVENOUS | Status: DC | PRN
  Administered 2014-01-22: 200 mg via INTRAVENOUS

## 2014-01-22 MED ORDER — CEFAZOLIN SODIUM-DEXTROSE 2-3 GM-% IV SOLR
INTRAVENOUS | Status: AC
  Filled 2014-01-22: qty 50

## 2014-01-22 MED ORDER — EPINEPHRINE HCL 1 MG/ML IJ SOLN
INTRAMUSCULAR | Status: AC
  Filled 2014-01-22: qty 1

## 2014-01-22 MED ORDER — FENTANYL CITRATE 0.05 MG/ML IJ SOLN
INTRAMUSCULAR | Status: DC | PRN
  Administered 2014-01-22: 100 ug via INTRAVENOUS

## 2014-01-22 MED ORDER — PROMETHAZINE HCL 25 MG/ML IJ SOLN: 6.2500 mg | INTRAMUSCULAR | Status: DC | PRN

## 2014-01-22 MED ORDER — OXYCODONE HCL 5 MG/5ML PO SOLN: 5.0000 mg | Freq: Once | ORAL | Status: DC | PRN

## 2014-01-22 MED ORDER — FENTANYL CITRATE 0.05 MG/ML IJ SOLN
INTRAMUSCULAR | Status: AC
  Filled 2014-01-22: qty 6

## 2014-01-22 MED ORDER — DEXAMETHASONE SODIUM PHOSPHATE 4 MG/ML IJ SOLN
INTRAMUSCULAR | Status: DC | PRN
  Administered 2014-01-22: 10 mg via INTRAVENOUS

## 2014-01-22 MED ORDER — MIDAZOLAM HCL 2 MG/2ML IJ SOLN
INTRAMUSCULAR | Status: AC
  Filled 2014-01-22: qty 2

## 2014-01-22 MED ORDER — SUCCINYLCHOLINE CHLORIDE 20 MG/ML IJ SOLN
INTRAMUSCULAR | Status: DC | PRN
  Administered 2014-01-22: 100 mg via INTRAVENOUS

## 2014-01-22 MED ORDER — OXYCODONE HCL 5 MG PO TABS: 5.0000 mg | ORAL_TABLET | Freq: Once | ORAL | Status: DC | PRN

## 2014-01-22 MED ORDER — MIDAZOLAM HCL 5 MG/5ML IJ SOLN
INTRAMUSCULAR | Status: DC | PRN
  Administered 2014-01-22: 2 mg via INTRAVENOUS

## 2014-01-22 SURGICAL SUPPLY — 23 items
CANISTER SUCT 1200ML W/VALVE (MISCELLANEOUS) ×3 IMPLANT
CONT SPEC 4OZ CLIKSEAL STRL BL (MISCELLANEOUS) IMPLANT
GLOVE ECLIPSE 6.5 STRL STRAW (GLOVE) ×3 IMPLANT
GLOVE SS BIOGEL STRL SZ 7.5 (GLOVE) ×1 IMPLANT
GLOVE SUPERSENSE BIOGEL SZ 7.5 (GLOVE) ×2
GOWN STRL REUS W/ TWL LRG LVL3 (GOWN DISPOSABLE) ×1 IMPLANT
GOWN STRL REUS W/ TWL XL LVL3 (GOWN DISPOSABLE) IMPLANT
GOWN STRL REUS W/TWL LRG LVL3 (GOWN DISPOSABLE) ×2
GOWN STRL REUS W/TWL XL LVL3 (GOWN DISPOSABLE)
GUARD TEETH (MISCELLANEOUS) IMPLANT
NEEDLE HYPO 18GX1.5 BLUNT FILL (NEEDLE) IMPLANT
NEEDLE SPNL 22GX7 QUINCKE BK (NEEDLE) IMPLANT
NS IRRIG 1000ML POUR BTL (IV SOLUTION) IMPLANT
PATTIES SURGICAL .5 X3 (DISPOSABLE) IMPLANT
SHEET MEDIUM DRAPE 40X70 STRL (DRAPES) ×3 IMPLANT
SLEEVE SCD COMPRESS KNEE MED (MISCELLANEOUS) IMPLANT
SPONGE GAUZE 4X4 12PLY STER LF (GAUZE/BANDAGES/DRESSINGS) ×6 IMPLANT
SURGILUBE 2OZ TUBE FLIPTOP (MISCELLANEOUS) ×3 IMPLANT
SYR 5ML LL (SYRINGE) IMPLANT
SYR CONTROL 10ML LL (SYRINGE) IMPLANT
TOWEL OR 17X24 6PK STRL BLUE (TOWEL DISPOSABLE) ×3 IMPLANT
TUBE CONNECTING 20'X1/4 (TUBING) ×1
TUBE CONNECTING 20X1/4 (TUBING) ×2 IMPLANT

## 2014-01-22 NOTE — Interval H&P Note (Signed)
History and Physical Interval Note:  01/22/2014 7:30 AM  Jason Luna  has presented today for surgery, with the diagnosis of ESOPHAGEAL STENOSIS  The various methods of treatment have been discussed with the patient and family. After consideration of risks, benefits and other options for treatment, the patient has consented to  Procedure(s): ESOPHAGEAL DILATION WITH THE SAVORY DILATORS (N/A) as a surgical intervention .  The patient's history has been reviewed, patient examined, no change in status, stable for surgery.  I have reviewed the patient's chart and labs.  Questions were answered to the patient's satisfaction.     Shelia Magallon

## 2014-01-22 NOTE — Transfer of Care (Signed)
Immediate Anesthesia Transfer of Care Note  Patient: Jason Luna  Procedure(s) Performed: Procedure(s): ESOPHAGEAL DILATION WITH THE SAVORY DILATORS (N/A)  Patient Location: PACU  Anesthesia Type:General  Level of Consciousness: awake  Airway & Oxygen Therapy: Patient Spontanous Breathing and Patient connected to face mask oxygen  Post-op Assessment: Report given to PACU RN and Post -op Vital signs reviewed and stable  Post vital signs: Reviewed and stable  Complications: No apparent anesthesia complications

## 2014-01-22 NOTE — Anesthesia Preprocedure Evaluation (Signed)
Anesthesia Evaluation  Patient identified by MRN, date of birth, ID band Patient awake    Reviewed: Allergy & Precautions, H&P , NPO status , Patient's Chart, lab work & pertinent test results  Airway       Dental   Pulmonary neg pulmonary ROS,  breath sounds clear to auscultation        Cardiovascular negative cardio ROS  Rhythm:regular Rate:Normal     Neuro/Psych  Headaches, PSYCHIATRIC DISORDERS negative psych ROS   GI/Hepatic negative GI ROS, Neg liver ROS, GERD-  ,  Endo/Other  negative endocrine ROS  Renal/GU negative Renal ROS     Musculoskeletal   Abdominal   Peds  Hematology   Anesthesia Other Findings   Reproductive/Obstetrics negative OB ROS                           Anesthesia Physical Anesthesia Plan  ASA: II  Anesthesia Plan: General ETT   Post-op Pain Management:    Induction:   Airway Management Planned:   Additional Equipment:   Intra-op Plan:   Post-operative Plan:   Informed Consent:   Dental Advisory Given  Plan Discussed with: Anesthesiologist, CRNA and Surgeon  Anesthesia Plan Comments:         Anesthesia Quick Evaluation

## 2014-01-22 NOTE — Discharge Instructions (Addendum)
Use tylenol or motrin prn pain.  Or throat lozenges for sore throat as needed. Start with soft diet and advance diet as tolerated. Call Dr Lucia Gaskins if you have any temp > 100 Call office for follow up appt in 1 week    [phone]    Post Anesthesia Home Care Instructions  Activity: Get plenty of rest for the remainder of the day. A responsible adult should stay with you for 24 hours following the procedure.  For the next 24 hours, DO NOT: -Drive a car -Paediatric nurse -Drink alcoholic beverages -Take any medication unless instructed by your physician -Make any legal decisions or sign important papers.  Meals: Start with liquid foods such as gelatin or soup. Progress to regular foods as tolerated. Avoid greasy, spicy, heavy foods. If nausea and/or vomiting occur, drink only clear liquids until the nausea and/or vomiting subsides. Call your physician if vomiting continues.  Special Instructions/Symptoms: Your throat may feel dry or sore from the anesthesia or the breathing tube placed in your throat during surgery. If this causes discomfort, gargle with warm salt water. The discomfort should disappear within 24 hours.

## 2014-01-22 NOTE — Brief Op Note (Signed)
01/22/2014  8:10 AM  PATIENT:  Jason Luna  48 y.o. male  PRE-OPERATIVE DIAGNOSIS:  ESOPHAGEAL STENOSIS  POST-OPERATIVE DIAGNOSIS:  ESOPHAGEAL STENOSIS  PROCEDURE:  Procedure(s): ESOPHAGEAL DILATION WITH THE SAVORY DILATORS (N/A) (10 - 19)  SURGEON:  Surgeon(s) and Role:    * Rozetta Nunnery, MD - Primary  PHYSICIAN ASSISTANT:   ASSISTANTS: none   ANESTHESIA:   general  EBL:     BLOOD ADMINISTERED:none  DRAINS: none   LOCAL MEDICATIONS USED:  NONE  SPECIMEN:  No Specimen  DISPOSITION OF SPECIMEN:  N/A  COUNTS:  YES  TOURNIQUET:  * No tourniquets in log *  DICTATION: .Other Dictation: Dictation Number [id number]  PLAN OF CARE: Discharge to home after PACU  PATIENT DISPOSITION:  PACU - hemodynamically stable.   Delay start of Pharmacological VTE agent (>24hrs) due to surgical blood loss or risk of bleeding: not applicable

## 2014-01-22 NOTE — Anesthesia Procedure Notes (Signed)
Procedure Name: Intubation Date/Time: 01/22/2014 7:41 AM Performed by: Melynda Ripple D Pre-anesthesia Checklist: Patient identified, Emergency Drugs available, Suction available and Patient being monitored Patient Re-evaluated:Patient Re-evaluated prior to inductionOxygen Delivery Method: Circle System Utilized Preoxygenation: Pre-oxygenation with 100% oxygen Intubation Type: IV induction Ventilation: Mask ventilation without difficulty Grade View: Grade II Tube type: Oral Tube size: 6.5 mm Number of attempts: 1 Airway Equipment and Method: stylet,  oral airway and Video-laryngoscopy Placement Confirmation: ETT inserted through vocal cords under direct vision,  positive ETCO2 and breath sounds checked- equal and bilateral Secured at: 23 cm Tube secured with: Tape Dental Injury: Teeth and Oropharynx as per pre-operative assessment  Difficulty Due To: Difficulty was anticipated, Difficult Airway- due to limited oral opening and Difficult Airway- due to anterior larynx

## 2014-01-22 NOTE — Anesthesia Postprocedure Evaluation (Signed)
Anesthesia Post Note  Patient: Jason Luna  Procedure(s) Performed: Procedure(s) (LRB): ESOPHAGEAL DILATION WITH THE SAVORY DILATORS (N/A)  Anesthesia type: general  Patient location: PACU  Post pain: Pain level controlled  Post assessment: Patient's Cardiovascular Status Stable  Last Vitals:  Filed Vitals:   01/22/14 0910  BP: 121/86  Pulse: 71  Temp: 36.4 C  Resp: 20    Post vital signs: Reviewed and stable  Level of consciousness: sedated  Complications: No apparent anesthesia complications

## 2014-01-22 NOTE — Op Note (Signed)
NAME:  [name], [name] NO.:  [id number]  MEDICAL RECORD NO.:  [id number]  LOCATION:                                 FACILITY:  PHYSICIAN:  Gretna Lucia Gaskins, M.D.DATE OF BIRTH:  03-17-1966  DATE OF PROCEDURE:  01/22/2014 DATE OF DISCHARGE:  01/22/2014                              OPERATIVE REPORT   PREOPERATIVE DIAGNOSIS:  Dysphagia with esophageal stenosis.  POSTOPERATIVE DIAGNOSIS:  Dysphagia with esophageal stenosis.  OPERATION PERFORMED:  Direct laryngoscopy, esophagoscopy with dilation of cervical esophagus with Savary dilators (#10 through #19).  SURGEON:  Leonides Sake. Lucia Gaskins, M.D.  ANESTHESIA:  General endotracheal.  COMPLICATIONS:  None.  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  Following his treatment, he had some trouble swallowing and has had previous dilation of his cervical esophagus secondary to a stricture.  His last dilation was approximately 10 months ago.  He has had recurrent problems of swallowing with food getting caught around the level of the upper cervical esophagus.  He is taken to the operating room at this time for repeat dilation with Savary dilators.  DESCRIPTION OF PROCEDURE:  After adequate endotracheal anesthesia, direct laryngoscopy was performed.  On direct laryngoscopy, the base of tongue, epiglottis, AE folds, false and true cords were clear.  Both piriform sinuses were clear.  Cervical esophagoscopy was attempted, but this was difficult because of some restriction of motion of his neck as well as small opening of his jaw.  The guidewire of the Savary dilators was passed under direct visualization and passed down without difficulty to about 30 cm.  Beginning with the 10 mm dilator, dilation was performed sequentially up to #19 without any difficulty.  Surg-I-Loop was used to  cut the dilators.  Following dilation to the #19 Savary dilator, the procedure was completed.  Following dilation, direct laryngoscopy was performed again down to the opening of the cervical esophagus and everything appeared clear with no significant trauma.  The patient was subsequently awoken from anesthesia and transferred to the recovery room postop doing well.  DISPOSITION:  Jason Luna was discharged home later this morning.  He is instructed to start with a soft diet and advance diet as tolerated.  He will notify us if he has any fever.  We will have him follow up in my office in 1 week for recheck.    ______________________________ Leonides Sake. Lucia Gaskins, M.D.   ______________________________ Leonides Sake. Lucia Gaskins, M.D.    CEN/MEDQ  D:  01/22/2014  T:  01/22/2014  Job:  [id number]

## 2014-01-23 ENCOUNTER — Encounter (HOSPITAL_BASED_OUTPATIENT_CLINIC_OR_DEPARTMENT_OTHER): Payer: Self-pay | Admitting: Otolaryngology

## 2014-03-29 ENCOUNTER — Other Ambulatory Visit: Payer: Self-pay | Admitting: Family Medicine

## 2014-04-04 ENCOUNTER — Other Ambulatory Visit: Payer: Self-pay | Admitting: Family Medicine

## 2014-05-07 ENCOUNTER — Encounter: Payer: Self-pay | Admitting: Physician Assistant

## 2014-05-07 ENCOUNTER — Ambulatory Visit (INDEPENDENT_AMBULATORY_CARE_PROVIDER_SITE_OTHER): Payer: 59 | Admitting: Physician Assistant

## 2014-05-07 VITALS — BP 120/80 | HR 68 | Temp 98.0°F | Resp 18 | Wt 166.0 lb

## 2014-05-07 DIAGNOSIS — R509 Fever, unspecified: Secondary | ICD-10-CM

## 2014-05-07 DIAGNOSIS — R51 Headache: Secondary | ICD-10-CM

## 2014-05-07 NOTE — Patient Instructions (Signed)
We will draw some blood work today to assess for infection.  Whenever your lab results return from the urgent care, please send [place] a copy.  Continue symptom care with over the counter medication like tylenol and ibuprofen to relieve pain and headache.  If emergency symptoms discussed during visit developed, seek medical attention immediately.  Followup as needed, or for worsening or persistent symptoms despite treatment.   Tension Headache A tension headache is pain, pressure, or aching felt over the front and sides of the head. Tension headaches often come after stress, feeling worried (anxiety), or feeling sad or down for a while (depressed). HOME CARE  Only take medicine as told by your doctor.  Lie down in a dark, quiet room when you have a headache.  Keep a journal to find out if certain things bring on headaches. For example, write down:  What you eat and drink.  How much sleep you get.  Any change to your diet or medicines.  Relax by getting a massage or doing other relaxing activities.  Put ice or heat packs on the head and neck area as told by your doctor.  Lessen stress.  Sit up straight. Do not tighten (tense) your muscles.  Quit smoking if you smoke.  Lessen how much alcohol you drink.  Lessen how much caffeine you drink, or stop drinking caffeine.  Eat and exercise regularly.  Get enough sleep.  Avoid using too much pain medicine. GET HELP RIGHT AWAY IF:   Your headache becomes really bad.  You have a fever.  You have a stiff neck.  You have trouble seeing.  Your muscles are weak, or you lose muscle control.  You lose your balance or have trouble walking.  You feel like you will pass out (faint), or you pass out.  You have really bad symptoms that are different than your first symptoms.  You have problems with the medicines given to you by your doctor.  Your medicines do not work.  Your headache feels different than the other  headaches.  You feel sick to your stomach (nauseous) or throw up (vomit). MAKE SURE YOU:   Understand these instructions.  Will watch your condition.  Will get help right away if you are not doing well or get worse. Document Released: 01/12/2010 Document Revised: 01/10/2012 Document Reviewed: 10/08/2011 [facility] Patient Information 2015 [place], [place]. This information is not intended to replace advice given to you by your health care provider. Make sure you discuss any questions you have with your health care provider. Fever, Adult A fever is a temperature of 100.4 F (38 C) or above.  HOME CARE  Take fever medicine as told by your doctor. Do not  take aspirin for fever if you are younger than 50 years of age.  If you are given antibiotic medicine, take it as told. Finish the medicine even if you start to feel better.  Rest.  Drink enough fluids to keep your pee (urine) clear or pale yellow. Do not drink alcohol.  Take a bath or shower with room temperature water. Do not use ice water or alcohol sponge baths.  Wear lightweight, loose clothes. GET HELP RIGHT AWAY IF:   You are short of breath or have trouble breathing.  You are very weak.  You are dizzy or you pass out (faint).  You are very thirsty or are making little or no urine.  You have new pain.  You throw up (vomit) or have watery poop (diarrhea).  You  keep throwing up or having watery poop for more than 1 to 2 days.  You have a stiff neck or light bothers your eyes.  You have a skin rash.  You have a fever or problems (symptoms) that last for more than 2 to 3 days.  You have a fever and your problems quickly get worse.  You keep throwing up the fluids you drink.  You do not feel better after 3 days.  You have new problems. MAKE SURE YOU:   Understand these instructions.  Will watch your condition.  Will get help right away if you are not doing well or get worse. Document Released: 07/27/2008  Document Revised: 01/10/2012 Document Reviewed: 08/19/2011 [facility] Patient Information 2015 [place], [place]. This information is not intended to replace advice given to you by your health care provider. Make sure you discuss any questions you have with your health care provider.

## 2014-05-07 NOTE — Progress Notes (Signed)
Pre visit review using our clinic review tool, if applicable. No additional management support is needed unless otherwise documented below in the visit note. 

## 2014-05-07 NOTE — Progress Notes (Signed)
Subjective:    Patient ID: [name], male    DOB: 1966-04-24, 48 y.o.   MRN: [id number]  HPI Patient is a 48 y.o. male presenting for head and neck ache. Pt states that he has had HA and neck ache for about 1 and half weeks. He woke up Friday morning with a Fever and chills, he went to urgent care. At [facility] he had blood drawn. States that his temperature was 101.6 at the UC. He was given tylenol, which caused the fever to decrease. He then alternated tylenol and ibuprofen until his fever resolved on Sunday. He states that today his main concerns are headache, neck ache, lethargy. He states that he had a little antibiotics left (levaquin he thinks) and so he took these, which he does not believe helped. He has also felt a little nauseous at times, but hasnt thrown up. He has experienced a lack of appetite, however this is improving. He also reported 4 loose stools 2 days ago, 3 loose stools yesterday, and 2 loose stools today. He denies watery stools. He denies any tick bites, exposure to food poisoning. He has also had a little bit of congestion. He denies cough, shortness of breath, chest pain, syncope, no blood in stool, and no rashes that he has noticed.    Review of Systems As per HPI and are otherwise negative.    Past Medical History  Diagnosis Date  . Allergy   . Headache(784.0)   . Gastritis   . Weight loss, abnormal 2012    PEG tube placement Oct 2012  . 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 History of radiation therapy 07/01/11 -08/19/11    right tonsil  . Migraines   . Nasal septal perforation     hx of chronic  . Anxiety and depression   . History of throat cancer 07/05/2012    He's had radiation and chemotherapies   . Acute appendicitis 07/05/2012  . Hx of migraines 07/05/2012  . 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 GERD (gastroesophageal reflux disease)     History   Social History  . Marital Status: Divorced    Spouse  Name: N/A    Number of Children: N/A  . Years of Education: N/A   Occupational History  . Not on file.   Social History Main Topics  . Smoking status: Never Smoker   . Smokeless tobacco: Not on file  . Alcohol Use: Yes     Comment: 1 - 2 ALCOHOLIC BEVERAGES WEEKLY  . Drug Use: Yes     Comment: COCAINE USE OVER 10 YRS AGO  . Sexual Activity: Not on file   Other Topics Concern  . Not on file   Social History Narrative  . No narrative on file    Past Surgical History  Procedure Laterality Date  . Nasal sinus surgery    . Peg tube placement  08/09/11 at [facility] [place] IR    peg out now-2014  . Laparoscopic appendectomy  07/05/2012    Procedure: APPENDECTOMY LAPAROSCOPIC;  Surgeon: Joyice Faster. Cornett, MD;  Location: [facility];  Service: General;  Laterality: N/A;  . Peg tube removal    . Esophageal dilation  09/11/2012    Procedure: ESOPHAGEAL DILATION;  Surgeon: Rozetta Nunnery, MD;  Location: [place];  Service: ENT;  Laterality: Bilateral;  ESOPHAGOSCOPY WITH SAVORY DILATORS   . Esophageal dilation N/A 04/17/2013    Procedure: ESOPHAGEAL DILATION;  Surgeon:  Rozetta Nunnery, MD;  Location: [facility];  Service: ENT;  Laterality: N/A;  . Esophageal dilation N/A 01/22/2014    Procedure: ESOPHAGEAL DILATION WITH THE SAVORY DILATORS;  Surgeon: Rozetta Nunnery, MD;  Location: [place];  Service: ENT;  Laterality: N/A;    Family History  Problem Relation Age of Onset  . GI problems Mother     crohns  . Asthma Other   . Cancer Other     prostate,skin  . Leukemia Mother     ALL age 26  . Cancer Father     pancreatic    Allergies  Allergen Reactions  . Naproxen Sodium Anaphylaxis  . Bee Venom Swelling  . Zofran [Ondansetron Hcl]     Current Outpatient Prescriptions on File Prior to Visit  Medication Sig Dispense Refill  . atenolol (TENORMIN) 25 MG tablet Take 1 tablet (25 mg total) by mouth daily.  90 tablet  3  .  cevimeline (EVOXAC) 30 MG capsule Take 30 mg by mouth 3 (three) times daily.      . divalproex (DEPAKOTE) 250 MG DR tablet take 1 tablet by mouth once daily  100 tablet  3  . EPINEPHrine (EPIPEN) 0.3 mg/0.3 mL SOAJ injection Inject 0.3 mLs (0.3 mg total) into the muscle once.  2 Device  3  . LORazepam (ATIVAN) 1 MG tablet take 1 tablet by mouth if needed for anxiety  60 tablet  5  . omeprazole (PRILOSEC) 20 MG capsule Take 1 capsule (20 mg total) by mouth 2 (two) times daily.  60 capsule  1  . promethazine (PHENERGAN) 12.5 MG suppository Place 1 suppository (12.5 mg total) rectally every 6 (six) hours as needed for nausea or vomiting.  12 each  3  . promethazine (PHENERGAN) 25 MG tablet Take 1 tablet (25 mg total) by mouth every 8 (eight) hours as needed for nausea or vomiting.  30 tablet  2  . rizatriptan (MAXALT) 10 MG tablet take 1 tablet by mouth if needed for MIGRAINES may repeat after 2 hours if needed  10 tablet  5   No current facility-administered medications on file prior to visit.    EXAM: BP 120/80  Pulse 68  Temp(Src) 98 F (36.7 C) (Oral)  Resp 18  Wt 166 lb (75.297 kg)  SpO2 98%     Objective:   Physical Exam  Nursing note and vitals reviewed. Constitutional: He is oriented to person, place, and time. He appears well-developed and well-nourished. No distress.  HENT:  Head: Normocephalic and atraumatic.  Right Ear: External ear normal.  Left Ear: External ear normal.  Nose: Nose normal.  Mouth/Throat: No oropharyngeal exudate.  Oropharynx is slightly erythematous, no exudate. Bilateral TMs normal. Bilateral frontal and maxillary sinuses non-TTP.   Eyes: Conjunctivae and EOM are normal. Pupils are equal, round, and reactive to light.  Neck: Normal range of motion. Neck supple. No JVD present.  Cardiovascular: Normal rate, regular rhythm and intact distal pulses.   Pulmonary/Chest: Effort normal and breath sounds normal. No stridor. No respiratory distress. He  exhibits no tenderness.  Abdominal: Soft. Bowel sounds are normal. There is no tenderness.  Musculoskeletal: Normal range of motion. He exhibits no edema and no tenderness.  No nuchal rigidity. Negative brudzinskis sign. No-TTP of paraspinal and neck musculature, or bony tenderness.  Lymphadenopathy:    He has no cervical adenopathy.  Neurological: He is alert and oriented to person, place, and time. He has normal reflexes. He displays  normal reflexes. No cranial nerve deficit. He exhibits normal muscle tone. Coordination normal.  Skin: Skin is warm and dry. No rash noted. He is not diaphoretic. No erythema. No pallor.  Psychiatric: He has a normal mood and affect. His behavior is normal. Judgment and thought content normal.     Lab Results  Component Value Date   WBC 5.4 05/09/2013   HGB 14.1 01/22/2014   HCT 37.8* 05/09/2013   PLT 191 05/09/2013   GLUCOSE 73 01/17/2014   CHOL 169 08/11/2010   TRIG 209.0* 08/11/2010   HDL 31.90* 08/11/2010   LDLDIRECT 103.0 08/11/2010   ALT 17 05/09/2013   AST 20 05/09/2013   NA 139 01/17/2014   K 3.7 01/17/2014   CL 95* 01/17/2014   CREATININE 0.96 01/17/2014   BUN 17 01/17/2014   CO2 33* 01/17/2014   TSH 2.411 05/09/2013   INR 0.90 08/09/2011        Assessment & Plan:  Kasper was seen today for stiff neck, headache, and fever.  Diagnoses and associated orders for this visit:  Fever, unspecified Comments: Resolved. Will draw CBC to rule to assess white count. Watchful waiting. - CBC with Differential  Headache(784.0) Comments: tension type symptoms with neck pain. No meningeal signs. Try OTC analgesics. Watchful waiting. - CBC with Differential    Symptoms possibly improving: fever resolved Sunday, appetite returning, bowel movements occurring less frequently.  Will have patient treat headache and neck ache with Tylenol and ibuprofen.  CBC to check white count, will have patient bring urgent care lab results when they are available.  Watchful  waiting.  Return precautions provided, and patient handout on tension headache, and fever.  Plan to follow up as needed, or for worsening or persistent symptoms despite treatment.  Patient Instructions  We will draw some blood work today to assess for infection.  Whenever your lab results return from the urgent care, please send [place] a copy.  Continue symptom care with over the counter medication like tylenol and ibuprofen to relieve pain and headache.  If emergency symptoms discussed during visit developed, seek medical attention immediately.  Followup as needed, or for worsening or persistent symptoms despite treatment.

## 2014-05-08 ENCOUNTER — Encounter: Payer: Self-pay | Admitting: Hematology and Oncology

## 2014-05-08 ENCOUNTER — Ambulatory Visit (HOSPITAL_BASED_OUTPATIENT_CLINIC_OR_DEPARTMENT_OTHER): Payer: 59 | Admitting: Hematology and Oncology

## 2014-05-08 ENCOUNTER — Other Ambulatory Visit (HOSPITAL_BASED_OUTPATIENT_CLINIC_OR_DEPARTMENT_OTHER): Payer: 59

## 2014-05-08 ENCOUNTER — Other Ambulatory Visit: Payer: Self-pay | Admitting: Hematology and Oncology

## 2014-05-08 VITALS — BP 114/73 | HR 68 | Temp 97.4°F | Resp 18 | Ht 69.0 in | Wt 165.3 lb

## 2014-05-08 DIAGNOSIS — C774 Secondary and unspecified malignant neoplasm of inguinal and lower limb lymph nodes: Secondary | ICD-10-CM

## 2014-05-08 DIAGNOSIS — G609 Hereditary and idiopathic neuropathy, unspecified: Secondary | ICD-10-CM

## 2014-05-08 DIAGNOSIS — C090 Malignant neoplasm of tonsillar fossa: Secondary | ICD-10-CM

## 2014-05-08 DIAGNOSIS — B977 Papillomavirus as the cause of diseases classified elsewhere: Secondary | ICD-10-CM

## 2014-05-08 DIAGNOSIS — R5382 Chronic fatigue, unspecified: Secondary | ICD-10-CM

## 2014-05-08 DIAGNOSIS — R5383 Other fatigue: Secondary | ICD-10-CM

## 2014-05-08 DIAGNOSIS — C109 Malignant neoplasm of oropharynx, unspecified: Secondary | ICD-10-CM

## 2014-05-08 DIAGNOSIS — K117 Disturbances of salivary secretion: Secondary | ICD-10-CM

## 2014-05-08 DIAGNOSIS — R1310 Dysphagia, unspecified: Secondary | ICD-10-CM

## 2014-05-08 HISTORY — DX: Other fatigue: R53.83

## 2014-05-08 LAB — CBC WITH DIFFERENTIAL/PLATELET
BASO%: 0.5 % (ref 0.0–2.0)
BASOS PCT: 0.1 % (ref 0.0–3.0)
Basophils Absolute: 0 10*3/uL (ref 0.0–0.1)
Basophils Absolute: 0 10*3/uL (ref 0.0–0.1)
EOS PCT: 2.3 % (ref 0.0–5.0)
EOS%: 2.9 % (ref 0.0–7.0)
Eosinophils Absolute: 0.1 10*3/uL (ref 0.0–0.7)
Eosinophils Absolute: 0.2 10*3/uL (ref 0.0–0.5)
HCT: 38.5 % — ABNORMAL LOW (ref 39.0–52.0)
HCT: 41.2 % (ref 38.4–49.9)
HGB: 13.8 g/dL (ref 13.0–17.1)
Hemoglobin: 12.9 g/dL — ABNORMAL LOW (ref 13.0–17.0)
LYMPH%: 10.2 % — AB (ref 14.0–49.0)
LYMPHS ABS: 0.8 10*3/uL (ref 0.7–4.0)
Lymphocytes Relative: 13.7 % (ref 12.0–46.0)
MCH: 30.8 pg (ref 27.2–33.4)
MCHC: 33.6 g/dL (ref 30.0–36.0)
MCHC: 33.4 g/dL (ref 32.0–36.0)
MCV: 92.1 fl (ref 78.0–100.0)
MCV: 92 fL (ref 79.3–98.0)
MONO ABS: 0.4 10*3/uL (ref 0.1–1.0)
MONO#: 0.5 10*3/uL (ref 0.1–0.9)
MONO%: 9 % (ref 0.0–14.0)
Monocytes Relative: 6.2 % (ref 3.0–12.0)
NEUT%: 77.4 % — ABNORMAL HIGH (ref 39.0–75.0)
NEUTROS ABS: 4.2 10*3/uL (ref 1.5–6.5)
Neutro Abs: 4.5 10*3/uL (ref 1.4–7.7)
Neutrophils Relative %: 77.7 % — ABNORMAL HIGH (ref 43.0–77.0)
PLATELETS: 214 10*3/uL (ref 150.0–400.0)
PLATELETS: 216 10*3/uL (ref 140–400)
RBC: 4.18 Mil/uL — AB (ref 4.22–5.81)
RBC: 4.48 10*6/uL (ref 4.20–5.82)
RDW: 13.3 % (ref 11.5–15.5)
RDW: 12.9 % (ref 11.0–14.6)
WBC: 5.8 10*3/uL (ref 4.0–10.5)
WBC: 5.5 10*3/uL (ref 4.0–10.3)
lymph#: 0.6 10*3/uL — ABNORMAL LOW (ref 0.9–3.3)

## 2014-05-08 LAB — COMPREHENSIVE METABOLIC PANEL (CC13)
ALBUMIN: 4 g/dL (ref 3.5–5.0)
ALK PHOS: 86 U/L (ref 40–150)
ALT: 28 U/L (ref 0–55)
AST: 26 U/L (ref 5–34)
Anion Gap: 11 mEq/L (ref 3–11)
BILIRUBIN TOTAL: 0.55 mg/dL (ref 0.20–1.20)
BUN: 24.4 mg/dL (ref 7.0–26.0)
CO2: 28 mEq/L (ref 22–29)
Calcium: 10 mg/dL (ref 8.4–10.4)
Chloride: 103 mEq/L (ref 98–109)
Creatinine: 1 mg/dL (ref 0.7–1.3)
GLUCOSE: 80 mg/dL (ref 70–140)
POTASSIUM: 4.2 meq/L (ref 3.5–5.1)
Sodium: 142 mEq/L (ref 136–145)
TOTAL PROTEIN: 7.5 g/dL (ref 6.4–8.3)

## 2014-05-08 LAB — TSH CHCC: TSH: 2.775 m(IU)/L (ref 0.320–4.118)

## 2014-05-09 DIAGNOSIS — R1310 Dysphagia, unspecified: Secondary | ICD-10-CM | POA: Insufficient documentation

## 2014-05-09 LAB — T4, FREE: FREE T4: 1.14 ng/dL (ref 0.80–1.80)

## 2014-05-09 NOTE — Progress Notes (Signed)
[facility] progress notes  Patient Care Team: Dorena Cookey, MD as PCP - General Rexene Edison, MD as Consulting Physician (Radiation Oncology) Rozetta Nunnery, MD as Consulting Physician (Otolaryngology) Heath Lark, MD as Consulting Physician (Hematology and Oncology)  CHIEF COMPLAINTS/PURPOSE OF VISIT:  Right tonsil cancer  HISTORY OF PRESENTING ILLNESS:  Jason Luna 48 y.o. male was transferred to my care after his prior physician has left.  I reviewed the patient's records extensive and collaborated the history with the patient. Summary of his history is as follows: This patient was diagnosed with tonsillar cancer of the self palpated a lump on the right side of the neck. 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 He was treated with concurrent q3wk cisplatin and daily XRT betwen 06/2011 and 08/2011. Post operative course was complicated by severe weight loss which subsequently recovered. He had chronic dysphagia requiring dilatation of his esophagus x3. He also had mild hearing loss and very mild peripheral neuropathy. He has persistent dry mouth and mild altered taste sensation.   MEDICAL HISTORY:  Past Medical History  Diagnosis Date  . Allergy   . Headache(784.0)   . Gastritis   . Weight loss, abnormal 2012    PEG tube placement Oct 2012  . 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 History of radiation therapy 07/01/11 -08/19/11    right tonsil  . Migraines   . Nasal septal perforation     hx of chronic  . Anxiety and depression   . History of throat cancer 07/05/2012    He's had radiation and chemotherapies   . Acute appendicitis 07/05/2012  . Hx of migraines 07/05/2012  . 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 GERD (gastroesophageal reflux disease)    . Fatigue 05/08/2014    SURGICAL HISTORY: Past Surgical History  Procedure Laterality Date  . Nasal sinus surgery    . Peg tube placement  08/09/11 at [facility] IR    peg out now-2014  . Laparoscopic appendectomy  07/05/2012    Procedure: APPENDECTOMY LAPAROSCOPIC;  Surgeon: Joyice Faster. Cornett, MD;  Location: [facility];  Service: General;  Laterality: N/A;  . Peg tube removal    . Esophageal dilation  09/11/2012    Procedure: ESOPHAGEAL DILATION;  Surgeon: Rozetta Nunnery, MD;  Location: [place];  Service: ENT;  Laterality: Bilateral;  ESOPHAGOSCOPY WITH SAVORY DILATORS   . Esophageal dilation N/A 04/17/2013    Procedure: ESOPHAGEAL DILATION;  Surgeon: Rozetta Nunnery, MD;  Location: [place];  Service: ENT;  Laterality: N/A;  . Esophageal dilation N/A 01/22/2014    Procedure: ESOPHAGEAL DILATION WITH THE SAVORY DILATORS;  Surgeon: Rozetta Nunnery, MD;  Location: [place];  Service: ENT;  Laterality: N/A;    SOCIAL HISTORY: History   Social History  . Marital Status: Divorced    Spouse Name: N/A    Number of Children: N/A  . Years of Education: N/A   Occupational History  . Not on file.   Social History Main Topics  . Smoking status: Never Smoker   . Smokeless tobacco: Never Used  . Alcohol Use: Yes     Comment: 1 - 2 ALCOHOLIC BEVERAGES WEEKLY  . Drug Use: Yes     Comment: COCAINE USE OVER 10 YRS AGO  . Sexual Activity:  Not on file   Other Topics Concern  . Not on file   Social History Narrative  . No narrative on file    FAMILY HISTORY: Family History  Problem Relation Age of Onset  . GI problems Mother     crohns  . Asthma Other   . Cancer Other     prostate,skin  . Leukemia Mother     ALL age 41  . Cancer Father     pancreatic    ALLERGIES:  is allergic to naproxen sodium; bee venom; and zofran.  MEDICATIONS:  Current Outpatient Prescriptions  Medication Sig Dispense Refill  . atenolol  (TENORMIN) 25 MG tablet Take 1 tablet (25 mg total) by mouth daily.  90 tablet  3  . cevimeline (EVOXAC) 30 MG capsule Take 30 mg by mouth 3 (three) times daily.      . divalproex (DEPAKOTE) 250 MG DR tablet take 1 tablet by mouth once daily  100 tablet  3  . EPINEPHrine (EPIPEN) 0.3 mg/0.3 mL SOAJ injection Inject 0.3 mLs (0.3 mg total) into the muscle once.  2 Device  3  . LORazepam (ATIVAN) 1 MG tablet take 1 tablet by mouth if needed for anxiety  60 tablet  5  . omeprazole (PRILOSEC) 20 MG capsule Take 1 capsule (20 mg total) by mouth 2 (two) times daily.  60 capsule  1  . promethazine (PHENERGAN) 12.5 MG suppository Place 1 suppository (12.5 mg total) rectally every 6 (six) hours as needed for nausea or vomiting.  12 each  3  . promethazine (PHENERGAN) 25 MG tablet Take 1 tablet (25 mg total) by mouth every 8 (eight) hours as needed for nausea or vomiting.  30 tablet  2  . rizatriptan (MAXALT) 10 MG tablet take 1 tablet by mouth if needed for MIGRAINES may repeat after 2 hours if needed  10 tablet  5   No current facility-administered medications for this visit.    REVIEW OF SYSTEMS:   Constitutional: Denies fevers, chills or abnormal night sweats Eyes: Denies blurriness of vision, double vision or watery eyes Ears, nose, mouth, throat, and face: Denies mucositis or sore throat Respiratory: Denies cough, dyspnea or wheezes Cardiovascular: Denies palpitation, chest discomfort or lower extremity swelling Gastrointestinal:  Denies nausea, heartburn or change in bowel habits Skin: Denies abnormal skin rashes Lymphatics: Denies new lymphadenopathy or easy bruising Neurological:Denies numbness, tingling or new weaknesses Behavioral/Psych: Mood is stable, no new changes  All other systems were reviewed with the patient and are negative.  PHYSICAL EXAMINATION: ECOG PERFORMANCE STATUS: 1 - Symptomatic but completely ambulatory  Filed Vitals:   05/08/14 0854  BP: 114/73  Pulse: 68  Temp:  97.4 F (36.3 C)  Resp: 18   Filed Weights   05/08/14 0854  Weight: 165 lb 4.8 oz (74.98 kg)    GENERAL:alert, no distress and comfortable SKIN: skin color, texture, turgor are normal, no rashes or significant lesions EYES: normal, conjunctiva are pink and non-injected, sclera clear OROPHARYNX:no exudate, normal lips, buccal mucosa, and tongue  NECK: Neck is thick and mildly edematous from prior treatment, thyroid normal size, non-tender, without nodularity LYMPH:  no palpable lymphadenopathy in the cervical, axillary or inguinal LUNGS: clear to auscultation and percussion with normal breathing effort HEART: regular rate & rhythm and no murmurs without lower extremity edema ABDOMEN:abdomen soft, non-tender and normal bowel sounds Musculoskeletal:no cyanosis of digits and no clubbing  PSYCH: alert & oriented x 3 with fluent speech NEURO: no focal motor/sensory deficits  LABORATORY DATA:  I have reviewed the data as listed Lab Results  Component Value Date   WBC 5.5 05/08/2014   HGB 13.8 05/08/2014   HCT 41.2 05/08/2014   MCV 92.0 05/08/2014   PLT 216 05/08/2014    Recent Labs  01/17/14 1430 05/08/14 0845  NA 139 142  K 3.7 4.2  CL 95*  --   CO2 33* 28  GLUCOSE 73 80  BUN 17 24.4  CREATININE 0.96 1.0  CALCIUM 9.9 10.0  GFRNONAA >90  --   GFRAA >90  --   PROT  --  7.5  ALBUMIN  --  4.0  AST  --  26  ALT  --  28  ALKPHOS  --  86  BILITOT  --  0.55    ASSESSMENT & PLAN:  Oropharynx cancer Clinically, he has no evidence of recurrence. I will discharge him from the clinic. I recommend yearly TSH monitoring by primary care provider. I recommend close followup with his ENT surgeon and radiation oncologist.  Dysphagia This is related to esophageal stricture from prior radiation treatment. He will continue followup with GI for intermittent dilatation as needed.   All questions were answered. The patient knows to call the clinic with any problems, questions or  concerns. I spent 15 minutes counseling the patient face to face. The total time spent in the appointment was 20 minutes and more than 50% was on counseling.     [facility], Gio Janoski, MD 05/09/2014 8:27 PM

## 2014-05-09 NOTE — Assessment & Plan Note (Signed)
Clinically, he has no evidence of recurrence. I will discharge him from the clinic. I recommend yearly TSH monitoring by primary care provider. I recommend close followup with his ENT surgeon and radiation oncologist.

## 2014-05-09 NOTE — Assessment & Plan Note (Signed)
This is related to esophageal stricture from prior radiation treatment. He will continue followup with GI for intermittent dilatation as needed.

## 2014-05-10 ENCOUNTER — Encounter: Payer: Self-pay | Admitting: *Deleted

## 2014-05-13 ENCOUNTER — Telehealth: Payer: Self-pay | Admitting: Radiation Oncology

## 2014-05-13 NOTE — Telephone Encounter (Signed)
Spoke to pt about his appt for tomorrow. He was agreeable. (lw)

## 2014-05-14 ENCOUNTER — Ambulatory Visit
Admission: RE | Admit: 2014-05-14 | Discharge: 2014-05-14 | Disposition: A | Discharge status: Home / Self Care | Payer: 59 | Source: Ambulatory Visit | Attending: Radiation Oncology | Admitting: Radiation Oncology

## 2014-05-14 ENCOUNTER — Encounter: Payer: Self-pay | Admitting: Radiation Oncology

## 2014-05-14 VITALS — BP 92/54 | HR 62 | Temp 98.4°F | Resp 20 | Wt 164.7 lb

## 2014-05-14 DIAGNOSIS — C109 Malignant neoplasm of oropharynx, unspecified: Secondary | ICD-10-CM

## 2014-05-14 NOTE — Progress Notes (Signed)
Patient denies pain, fatigue, loss of appetite. He states he has difficulty eating chicken but can eat many foods. He has occasional headaches, no jaw pain if he continues to exercise his jaw. He had his esophagus stretched in March, Dr Lucia Gaskins.

## 2014-05-14 NOTE — Progress Notes (Signed)
CC: Dr. 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 He is without complaints today except for a recent "viral syndrome" with headaches. He is now improved. He was tested for [facility] spotted fever and this was negative. He saw Dr. Lucia Gaskins this past March for dilatation of his cervical esophagus. He maintains his dental followup. He saw Dr. Alvy Bimler earlier this month and she discharged him. His TSH on 05/08/2014 was 2.78, within normal limits.  Physical examination: Alert and oriented. Filed Vitals:   05/14/14 1047  BP: 92/54  Pulse: 62  Temp: 98.4 F (36.9 C)  Resp: 20   Nodes: There is no palpable lymphadenopathy in neck. Oral cavity and oropharynx are unremarkable to inspection and palpation. There is mild xerostomia. On indirect mirror examination there is no evidence for recurrent disease along the right oropharynx.  Impression: No evidence for recurrent disease. His prognosis is favorable.  Plan: Followup visit with me in 8 months. He'll maintain his followup with Dr. Lucia Gaskins.

## 2014-09-05 ENCOUNTER — Ambulatory Visit (INDEPENDENT_AMBULATORY_CARE_PROVIDER_SITE_OTHER): Payer: 59 | Admitting: Family Medicine

## 2014-09-05 ENCOUNTER — Encounter: Payer: Self-pay | Admitting: Family Medicine

## 2014-09-05 VITALS — BP 110/80 | Temp 98.4°F | Wt 169.0 lb

## 2014-09-05 DIAGNOSIS — G4700 Insomnia, unspecified: Secondary | ICD-10-CM

## 2014-09-05 MED ORDER — LORAZEPAM 0.5 MG PO TABS: 0.5000 mg | ORAL_TABLET | Freq: Two times a day (BID) | ORAL | Status: DC | PRN

## 2014-09-05 MED ORDER — TRAZODONE HCL 50 MG PO TABS: 25.0000 mg | ORAL_TABLET | Freq: Every evening | ORAL | Status: DC | PRN

## 2014-09-05 NOTE — Progress Notes (Signed)
   Subjective:    Patient ID: Jason Luna, male    DOB: [birth year], 48 y.o.   MRN: [id number]  HPI Josey is a 48 year old male who comes in today for evaluation of insomnia for 3 months  He's been stressed out the last 3 months she's been in the process of moving from his home and he's had a lot of job stress. He's: Off caffeine completely does not drink any alcohol and he does exercise on a regular basis. He goes to bed around 10:30 or 11 wakes up at wanting can go back to sleep. No previous history of sleep dysfunction or depression   Review of Systems    review of systems otherwise negative Objective:   Physical Exam  Well-developed well-nourished male no acute distress vital signs stable he is afebrile      Assessment & Plan:  .... Insomnia............ Begin trazodone cover with low-dose Ativan until the trazodone begins to work follow-up in 4 weeks

## 2014-09-05 NOTE — Progress Notes (Signed)
Pre visit review using our clinic review tool, if applicable. No additional management support is needed unless otherwise documented below in the visit note. 

## 2014-09-05 NOTE — Patient Instructions (Signed)
Trazodone 50 mg........Marland Kitchen 1 at bedtime  Ativan 0.5........Marland Kitchen 1 at bedtime  When you begin to sleep better then stop the Ativan  Return in 4 weeks for follow-up

## 2014-11-21 ENCOUNTER — Telehealth: Payer: Self-pay | Admitting: Family Medicine

## 2014-11-21 NOTE — Telephone Encounter (Signed)
Patient called regarding the below medication request.

## 2014-12-30 ENCOUNTER — Telehealth: Payer: Self-pay | Admitting: *Deleted

## 2014-12-30 NOTE — Telephone Encounter (Signed)
Spoke with patient and last tetanus was 08/27/10.

## 2014-12-30 NOTE — Telephone Encounter (Addendum)
Pt called stating he stepped on 2 nails over the weekend and they were not rusty, pt's last td shot was in 2011, pt is concerned if he needs another one. Please advise [phone]

## 2015-01-01 ENCOUNTER — Other Ambulatory Visit: Payer: Self-pay | Admitting: Family Medicine

## 2015-02-21 ENCOUNTER — Other Ambulatory Visit: Payer: Self-pay | Admitting: *Deleted

## 2015-02-21 MED ORDER — ATENOLOL 25 MG PO TABS: 25.0000 mg | ORAL_TABLET | Freq: Every day | ORAL | Status: DC

## 2015-02-25 ENCOUNTER — Ambulatory Visit (INDEPENDENT_AMBULATORY_CARE_PROVIDER_SITE_OTHER): Payer: 59 | Admitting: Family Medicine

## 2015-02-25 ENCOUNTER — Encounter: Payer: Self-pay | Admitting: Family Medicine

## 2015-02-25 VITALS — BP 110/80 | Temp 103.0°F | Wt 167.0 lb

## 2015-02-25 DIAGNOSIS — J111 Influenza due to unidentified influenza virus with other respiratory manifestations: Secondary | ICD-10-CM

## 2015-02-25 DIAGNOSIS — J1189 Influenza due to unidentified influenza virus with other manifestations: Secondary | ICD-10-CM | POA: Diagnosis not present

## 2015-02-25 DIAGNOSIS — J029 Acute pharyngitis, unspecified: Secondary | ICD-10-CM

## 2015-02-25 MED ORDER — HYDROCODONE-HOMATROPINE 5-1.5 MG/5ML PO SYRP: 5.0000 mL | ORAL_SOLUTION | Freq: Three times a day (TID) | ORAL | Status: DC | PRN

## 2015-02-25 NOTE — Progress Notes (Signed)
   Subjective:    Patient ID: Jason Luna, male    DOB: December 19, 1965, 49 y.o.   MRN: [id number]  HPI Jason Luna is a 49 year old male nonsmoker who comes in today with a four-day history of fever chills a can all over sore throat and cough  He states he felt well until Saturday around 11 in the morning he got a gradual onset of severe sore throat. He then developed fever and chills. He's had a slight headache cough no nausea vomiting diarrhea. No skin rash in contact with any tics   Review of Systems Review of systems otherwise negative    Objective:   Physical Exam  Well-developed well-nourished male no acute distress vital signs stable he is afebrile HEENT were negative neck was supple no adenopathy lungs are clear abdominal exam negative skin normal      Assessment & Plan:  Influenza type illness........Marland Kitchen rest at home........ lots of liquids......... Tylenol Motrin for fever chills........ Hydromet 3 times a day when necessary.

## 2015-02-25 NOTE — Progress Notes (Signed)
Pre visit review using our clinic review tool, if applicable. No additional management support is needed unless otherwise documented below in the visit note. 

## 2015-02-25 NOTE — Patient Instructions (Signed)
Rest at home  Tylenol aspirin or Motrin for fever chills and aching all over  Drink lots of liquids  Hydromet 1/2-1 teaspoon 3 times daily  Return when necessary

## 2015-02-27 ENCOUNTER — Encounter: Payer: Self-pay | Admitting: Family Medicine

## 2015-02-27 ENCOUNTER — Ambulatory Visit (INDEPENDENT_AMBULATORY_CARE_PROVIDER_SITE_OTHER): Payer: 59 | Admitting: Family Medicine

## 2015-02-27 VITALS — BP 102/76 | HR 83 | Temp 98.6°F | Ht 69.0 in | Wt 167.0 lb

## 2015-02-27 DIAGNOSIS — T887XXA Unspecified adverse effect of drug or medicament, initial encounter: Secondary | ICD-10-CM | POA: Diagnosis not present

## 2015-02-27 DIAGNOSIS — R509 Fever, unspecified: Secondary | ICD-10-CM

## 2015-02-27 DIAGNOSIS — T50905A Adverse effect of unspecified drugs, medicaments and biological substances, initial encounter: Secondary | ICD-10-CM

## 2015-02-27 DIAGNOSIS — R21 Rash and other nonspecific skin eruption: Secondary | ICD-10-CM | POA: Diagnosis not present

## 2015-02-27 NOTE — Progress Notes (Signed)
HPI:  Acute visit for rash -reports just saw Dr. Sherren Mocha yesterday and given hycodan and after several doses developed itchy rash on neck and on his penis this morning -reports he came down with the flu 4 days ago and initially had ha, body aches, sore throat, congestion, cough, fevers - -he reports overall feels better today and this is the first day without a fever, however, he saw his urologist today for the rash on his penis and was told looked like a drug reaction -no rash elsewhere -denies HA today, NVD today, SOB, fever today, joint pain, swelling of face/mouth/throat, recent tick bite or exposure, recent travel  ROS: See pertinent positives and negatives per HPI.  Past Medical History  Diagnosis Date  . Allergy   . Headache(784.0)   . Gastritis   . Weight loss, abnormal 2012    PEG tube placement Oct 2012  . 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 History of radiation therapy 07/01/11 -08/19/11    right tonsil- 7000 cGy 35 sessions  . Migraines   . Nasal septal perforation     hx of chronic  . Anxiety and depression   . History of throat cancer 07/05/2012    He's had radiation and chemotherapies   . Acute appendicitis 07/05/2012  . Hx of migraines 07/05/2012  . 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 GERD (gastroesophageal reflux disease)   . Fatigue 05/08/2014    Past Surgical History  Procedure Laterality Date  . Nasal sinus surgery    . Peg tube placement  08/09/11 at [facility] IR    peg out now-2014  . Laparoscopic appendectomy  07/05/2012    Procedure: APPENDECTOMY LAPAROSCOPIC;  Surgeon: Joyice Faster. Cornett, MD;  Location: [facility];  Service: General;  Laterality: N/A;  . Peg tube removal    . Esophageal dilation  09/11/2012    Procedure: ESOPHAGEAL DILATION;  Surgeon: Rozetta Nunnery, MD;  Location: [place];  Service: ENT;  Laterality: Bilateral;  ESOPHAGOSCOPY WITH SAVORY DILATORS   . Esophageal dilation N/A 04/17/2013     Procedure: ESOPHAGEAL DILATION;  Surgeon: Rozetta Nunnery, MD;  Location: [place];  Service: ENT;  Laterality: N/A;  . Esophageal dilation N/A 01/22/2014    Procedure: ESOPHAGEAL DILATION WITH THE SAVORY DILATORS;  Surgeon: Rozetta Nunnery, MD;  Location: [place];  Service: ENT;  Laterality: N/A;    Family History  Problem Relation Age of Onset  . GI problems Mother     crohns  . Asthma Other   . Cancer Other     prostate,skin  . Leukemia Mother     ALL age 52  . Cancer Father     pancreatic    History   Social History  . Marital Status: Divorced    Spouse Name: N/A  . Number of Children: N/A  . Years of Education: N/A   Social History Main Topics  . Smoking status: Never Smoker   . Smokeless tobacco: Never Used  . Alcohol Use: Yes     Comment: 1 - 2 ALCOHOLIC BEVERAGES WEEKLY  . Drug Use: Yes     Comment: COCAINE USE OVER 10 YRS AGO  . Sexual Activity: Not on file   Other Topics Concern  . None   Social History Narrative     Current outpatient prescriptions:  .  atenolol (TENORMIN) 25 MG tablet, Take 1 tablet (25 mg total) by mouth daily., Disp:  90 tablet, Rfl: 3 .  cevimeline (EVOXAC) 30 MG capsule, Take 30 mg by mouth 3 (three) times daily., Disp: , Rfl:  .  divalproex (DEPAKOTE) 250 MG DR tablet, take 1 tablet by mouth once daily, Disp: 100 tablet, Rfl: 2 .  EPINEPHrine (EPIPEN) 0.3 mg/0.3 mL SOAJ injection, Inject 0.3 mLs (0.3 mg total) into the muscle once., Disp: 2 Device, Rfl: 3 .  HYDROcodone-homatropine (HYCODAN) 5-1.5 MG/5ML syrup, Take 5 mLs by mouth every 8 (eight) hours as needed., Disp: 240 mL, Rfl: 0 .  LORazepam (ATIVAN) 1 MG tablet, take 1 tablet by mouth if needed for anxiety, Disp: 60 tablet, Rfl: 5 .  omeprazole (PRILOSEC) 20 MG capsule, Take 1 capsule (20 mg total) by mouth 2 (two) times daily., Disp: 60 capsule, Rfl: 1 .  promethazine (PHENERGAN) 12.5 MG suppository, Place 1 suppository (12.5 mg  total) rectally every 6 (six) hours as needed for nausea or vomiting., Disp: 12 each, Rfl: 3 .  promethazine (PHENERGAN) 25 MG tablet, Take 1 tablet (25 mg total) by mouth every 8 (eight) hours as needed for nausea or vomiting., Disp: 30 tablet, Rfl: 2 .  rizatriptan (MAXALT) 10 MG tablet, take 1 tablet by mouth if needed for migraines may repeat after 2 hours if needed, Disp: 10 tablet, Rfl: 5 .  traZODone (DESYREL) 50 MG tablet, Take 0.5-1 tablets (25-50 mg total) by mouth at bedtime as needed for sleep., Disp: 30 tablet, Rfl: 3  EXAM:  Filed Vitals:   02/27/15 1506  BP: 102/76  Pulse: 83  Temp: 98.6 F (37 C)    Body mass index is 24.65 kg/(m^2).  GENERAL: vitals reviewed and listed above, alert, oriented, appears well hydrated and in no acute distress  HEENT: atraumatic, conjunttiva clear, no obvious abnormalities on inspection of external nose and ears  NECK: no obvious masses on inspection  LUNGS: clear to auscultation bilaterally, no wheezes, rales or rhonchi, good air movement  CV: HRRR, no peripheral edema  SKIN: sevaral wheals of raise erythematous tissue on neck bilaterally; raised sharply demarcated patch of erythematous skin on penis approx 2 cm in diameter - we evaluated his trunk, arms and extremities and there is absence of rash elsewhere  MS: moves all extremities without noticeable abnormality  PSYCH: pleasant and cooperative, no obvious depression or anxiety  ASSESSMENT AND PLAN:  Discussed the following assessment and plan:  Rash and nonspecific skin eruption Drug reaction, initial encounter -suspect mild drug reaction -advised stop hycodan, zyrtec or pepcid daily -had prednisone inj today for orthopedic issues -seek emergency care immediately if worsening, SOB, swelling on neck throat, mouth, face, etc, GI symptoms or other symptoms of severe allergic reaction  Febrile illness -resolving, afebrile today, likely flu or flu like illness, discussed other  etiologies and return precautions  -Patient advised to return or notify a doctor immediately if symptoms worsen or persist or new concerns arise.  There are no Patient Instructions on file for this visit.   Colin Benton R.

## 2015-02-27 NOTE — Progress Notes (Signed)
Pre visit review using our clinic review tool, if applicable. No additional management support is needed unless otherwise documented below in the visit note. 

## 2015-02-28 ENCOUNTER — Ambulatory Visit (INDEPENDENT_AMBULATORY_CARE_PROVIDER_SITE_OTHER): Payer: 59 | Admitting: Family Medicine

## 2015-02-28 DIAGNOSIS — R69 Illness, unspecified: Secondary | ICD-10-CM

## 2015-02-28 NOTE — Progress Notes (Signed)
Late cancel

## 2015-03-04 ENCOUNTER — Ambulatory Visit
Admission: RE | Admit: 2015-03-04 | Discharge: 2015-03-04 | Disposition: A | Discharge status: Home / Self Care | Payer: 59 | Source: Ambulatory Visit | Attending: Radiation Oncology | Admitting: Radiation Oncology

## 2015-03-04 ENCOUNTER — Encounter: Payer: Self-pay | Admitting: Radiation Oncology

## 2015-03-04 VITALS — BP 114/78 | HR 79 | Temp 98.0°F | Ht 69.0 in | Wt 168.6 lb

## 2015-03-04 DIAGNOSIS — C109 Malignant neoplasm of oropharynx, unspecified: Secondary | ICD-10-CM

## 2015-03-04 NOTE — Progress Notes (Signed)
Currently reports that swallowing with mild difficulty, but managing softer foods.   Finishing out antibiotics for nodular area wih a temp of 103.6 on his right neck which has now resolved.   He is maintaning his weight.

## 2015-03-04 NOTE — Progress Notes (Signed)
CC: Dr. Radene Journey, Dr. Heath Lark, Dr. 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  He saw Dr. Lucia Gaskins last week after developing acute lymphadenitis of the right neck.  He had a fever of 103.  He was placed on antibiotics and his fever and infection resolved.  He is still on cephalexin for a few more days.  He tells me he will see Dr. Lucia Gaskins again for a follow-up visit in September.  He tells me he will see Dr. Alvy Bimler later this year.  He also reports posterior neck headaches which seem to be related to tension but could be exacerbated by his history of radiation therapy.  He maintains close dental follow-up.  I believe that Dr. Calton Dach or Dr. Sherren Mocha is checking on his TSH which was 2.78 back in July 2015.  Physical examination: Alert and oriented.  Nodes: There is no palpable lymphadenopathy in the neck.  There are no signs of residual lymphangitis.  Oral cavity and oropharynx are unremarkable to inspection.  His mouth is moist.  Indirect mirror examination is unremarkable with no evidence for recurrent disease within the oropharynx.  Palpation confirmatory.  Impression: No evidence for recurrent disease.  He is almost 4 years out from his chemoradiation and I will release him to be followed by Dr. Lucia Gaskins and Dr. Alvy Bimler.  I did inform him of the head and neck [facility] program which she may attend.  Plan: As above.  I've not scheduled Jason Luna for a formal follow-up visit, but I would more than happy see him in the future should the need arise.

## 2015-10-20 ENCOUNTER — Other Ambulatory Visit: Payer: Self-pay | Admitting: Family Medicine

## 2016-01-23 ENCOUNTER — Encounter: Payer: Self-pay | Admitting: Family Medicine

## 2016-01-23 ENCOUNTER — Ambulatory Visit (INDEPENDENT_AMBULATORY_CARE_PROVIDER_SITE_OTHER): Payer: 59 | Admitting: Family Medicine

## 2016-01-23 VITALS — BP 131/89 | HR 84 | Temp 98.4°F | Resp 20 | Wt 174.8 lb

## 2016-01-23 DIAGNOSIS — R69 Illness, unspecified: Principal | ICD-10-CM

## 2016-01-23 DIAGNOSIS — J111 Influenza due to unidentified influenza virus with other respiratory manifestations: Secondary | ICD-10-CM | POA: Diagnosis not present

## 2016-01-23 MED ORDER — OSELTAMIVIR PHOSPHATE 75 MG PO CAPS: 75.0000 mg | ORAL_CAPSULE | Freq: Two times a day (BID) | ORAL | Status: DC

## 2016-01-23 NOTE — Progress Notes (Signed)
Patient ID: Jason Luna, male   DOB: 16-Apr-1966, 50 y.o.   MRN: [id number]    Jason Luna , 19-Jul-1966, 50 y.o., male MRN: [id number]  CC: Flu-like symptoms Subjective: Pt presents for an acute OV with complaints of body-aches of 1 day duration. Associated symptoms include nausea, dry cough, fever (100.2), chills, mild sore throat and headache. Patient denies vomit or diarrhea. Pt has tried nyquil, dayquil to ease their symptoms. 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  Had "neck" cancer (2012). Forgot about getting flu shot this year. Tdap UTD.  Allergies  Allergen Reactions  . Naproxen Sodium Anaphylaxis  . Other Other (See Comments)    headache  . Bee Venom Swelling  . Zofran [Ondansetron Hcl]    Social History  Substance Use Topics  . Smoking status: Never Smoker   . Smokeless tobacco: Never Used  . Alcohol Use: Yes     Comment: 1 - 2 ALCOHOLIC BEVERAGES WEEKLY   Past Medical History  Diagnosis Date  . Allergy   . Headache(784.0)   . Gastritis   . Weight loss, abnormal 2012    PEG tube placement Oct 2012  . 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 History of radiation therapy 07/01/11 -08/19/11    right tonsil- 7000 cGy 35 sessions  . Migraines   . Nasal septal perforation     hx of chronic  . Anxiety and depression   . History of throat cancer 07/05/2012    He's had radiation and chemotherapies   . Acute appendicitis 07/05/2012  . Hx of migraines 07/05/2012  . Oropharynx cancer ([place]) 2012    HPV positive-tx chemo-radiation  . GERD (gastroesophageal reflux disease)   . Fatigue 05/08/2014   Past Surgical History  Procedure Laterality Date  . Nasal sinus surgery    . Peg tube placement  08/09/11 at [facility]    peg out now-2014  . Laparoscopic appendectomy  07/05/2012    Procedure: APPENDECTOMY LAPAROSCOPIC;  Surgeon: Joyice Faster. Cornett, MD;  Location: [facility];  Service: General;  Laterality: N/A;  . Peg tube removal    . Esophageal  dilation  09/11/2012    Procedure: ESOPHAGEAL DILATION;  Surgeon: Rozetta Nunnery, MD;  Location: [place];  Service: ENT;  Laterality: Bilateral;  ESOPHAGOSCOPY WITH SAVORY DILATORS   . Esophageal dilation N/A 04/17/2013    Procedure: ESOPHAGEAL DILATION;  Surgeon: Rozetta Nunnery, MD;  Location: [place];  Service: ENT;  Laterality: N/A;  . Esophageal dilation N/A 01/22/2014    Procedure: ESOPHAGEAL DILATION WITH THE SAVORY DILATORS;  Surgeon: Rozetta Nunnery, MD;  Location: [place];  Service: ENT;  Laterality: N/A;   Family History  Problem Relation Age of Onset  . GI problems Mother     crohns  . Asthma Other   . Cancer Other     prostate,skin  . Leukemia Mother     ALL age 38  . Cancer Father     pancreatic     Medication List       This list is accurate as of: 01/23/16  1:19 PM.  Always use your most recent med list.               atenolol 25 MG tablet  Commonly known as:  TENORMIN  Take 1 tablet (25 mg total) by mouth daily.     cevimeline 30 MG capsule  Commonly known as:  EVOXAC  Take 30 mg by mouth 3 (three) times daily.     divalproex 250 MG DR tablet  Commonly known as:  DEPAKOTE  take 1 tablet by mouth once daily     EPINEPHrine 0.3 mg/0.3 mL Soaj injection  Commonly known as:  EPIPEN  Inject 0.3 mLs (0.3 mg total) into the muscle once.     HYDROcodone-acetaminophen 5-325 MG tablet  Commonly known as:  NORCO/VICODIN  Reported on 01/23/2016     LORazepam 1 MG tablet  Commonly known as:  ATIVAN  take 1 tablet by mouth if needed for anxiety     omeprazole 20 MG capsule  Commonly known as:  PRILOSEC  Take 1 capsule (20 mg total) by mouth 2 (two) times daily.     promethazine 12.5 MG suppository  Commonly known as:  PHENERGAN  Place 1 suppository (12.5 mg total) rectally every 6 (six) hours as needed for nausea or vomiting.     promethazine 25 MG tablet  Commonly known as:  PHENERGAN    Take 1 tablet (25 mg total) by mouth every 8 (eight) hours as needed for nausea or vomiting.     rizatriptan 10 MG tablet  Commonly known as:  MAXALT  take 1 tablet by mouth if needed for migraines may repeat after 2 hours if needed     traZODone 50 MG tablet  Commonly known as:  DESYREL  Take 0.5-1 tablets (25-50 mg total) by mouth at bedtime as needed for sleep.     triamcinolone 0.025 % cream  Commonly known as:  KENALOG       ROS: Negative, with the exception of above mentioned in HPI  Objective:  BP 131/89 mmHg  Pulse 84  Temp(Src) 98.4 F (36.9 C)  Resp 20  Wt 174 lb 12 oz (79.266 kg)  SpO2 98% Body mass index is 25.79 kg/(m^2). Gen: febrile. No acute distress. Nontoxic in appearance, well developed, well nourished, laying on exam table.  HENT: AT. [place]. Bilateral TM visualized, with bilateral air fluid levels. MMM, no oral lesions. Bilateral nares with mild erythema, absent nasal septum (partial). Throat without erythema or exudates. Cough and hoarseness present on exam.  Eyes:Pupils Equal Round Reactive to light, Extraocular movements intact,  Conjunctiva without redness, discharge or icterus. Neck/lymp/endocrine: Supple, No lymphadenopathy CV: RRR Chest: CTAB, no wheeze or crackles. Good air movement, normal resp effort.  Abd: Soft.NTND. BS present Skin: No rashes, purpura or petechiae.  Neuro: Normal gait. PERLA. EOMi. Alert. Oriented x3   Assessment/Plan: Jason Luna is a 50 y.o. male present for acute OV for  1. Influenza-like illness - Mucinex, rest, hydrate, supportive therapy - oseltamivir (TAMIFLU) 75 MG capsule; Take 1 capsule (75 mg total) by mouth 2 (two) times daily.  Dispense: 10 capsule; Refill: 0 - F/U PRN   electronically signed by:  Howard Pouch, DO  [place]

## 2016-01-23 NOTE — Patient Instructions (Signed)
Influenza, Adult Influenza ("the flu") is a viral infection of the respiratory tract. It occurs more often in winter months because people spend more time in close contact with one another. Influenza can make you feel very sick. Influenza easily spreads from person to person (contagious). CAUSES  Influenza is caused by a virus that infects the respiratory tract. You can catch the virus by breathing in droplets from an infected person's cough or sneeze. You can also catch the virus by touching something that was recently contaminated with the virus and then touching your mouth, nose, or eyes. RISKS AND COMPLICATIONS You may be at risk for a more severe case of influenza if you smoke cigarettes, have diabetes, have chronic heart disease (such as heart failure) or lung disease (such as asthma), or if you have a weakened immune system. Elderly people and pregnant women are also at risk for more serious infections. The most common problem of influenza is a lung infection (pneumonia). Sometimes, this problem can require emergency medical care and may be life threatening. SIGNS AND SYMPTOMS  Symptoms typically last 4 to 10 days and may include:  Fever.  Chills.  Headache, body aches, and muscle aches.  Sore throat.  Chest discomfort and cough.  Poor appetite.  Weakness or feeling tired.  Dizziness.  Nausea or vomiting. DIAGNOSIS  Diagnosis of influenza is often made based on your history and a physical exam. A nose or throat swab test can be done to confirm the diagnosis. TREATMENT  In mild cases, influenza goes away on its own. Treatment is directed at relieving symptoms. For more severe cases, your health care provider may prescribe antiviral medicines to shorten the sickness. Antibiotic medicines are not effective because the infection is caused by a virus, not by bacteria. HOME CARE INSTRUCTIONS  Take medicines only as directed by your health care provider.  Use a cool mist humidifier  to make breathing easier.  Get plenty of rest until your temperature returns to normal. This usually takes 3 to 4 days.  Drink enough fluid to keep your urine clear or pale yellow.  Cover yourmouth and nosewhen coughing or sneezing,and wash your handswellto prevent thevirusfrom spreading.  Stay homefromwork orschool untilthe fever is gonefor at least 19full day. PREVENTION  An annual influenza vaccination (flu shot) is the best way to avoid getting influenza. An annual flu shot is now routinely recommended for all adults in the [place] IF:  You experiencechest pain, yourcough worsens,or you producemore mucus.  Youhave nausea,vomiting, ordiarrhea.  Your fever returns or gets worse. SEEK IMMEDIATE MEDICAL CARE IF:  You havetrouble breathing, you become short of breath,or your skin ornails becomebluish.  You have severe painor stiffnessin the neck.  You develop a sudden headache, or pain in the face or ear.  You have nausea or vomiting that you cannot control. MAKE SURE YOU:   Understand these instructions.  Will watch your condition.  Will get help right away if you are not doing well or get worse.   This information is not intended to replace advice given to you by your health care provider. Make sure you discuss any questions you have with your health care provider.   Document Released: 10/15/2000 Document Revised: 11/08/2014 Document Reviewed: 01/17/2012 Elsevier Interactive Patient Education 2016 [place] , Mucinex, rest, hydrate, supportive therapy

## 2016-02-13 ENCOUNTER — Other Ambulatory Visit: Payer: Self-pay | Admitting: Family Medicine

## 2016-02-20 ENCOUNTER — Other Ambulatory Visit: Payer: Self-pay | Admitting: Family Medicine

## 2016-07-12 ENCOUNTER — Ambulatory Visit (INDEPENDENT_AMBULATORY_CARE_PROVIDER_SITE_OTHER): Payer: 59 | Admitting: Family Medicine

## 2016-07-12 ENCOUNTER — Encounter: Payer: Self-pay | Admitting: Family Medicine

## 2016-07-12 VITALS — BP 140/80 | HR 73 | Temp 97.8°F | Ht 69.0 in | Wt 171.1 lb

## 2016-07-12 DIAGNOSIS — G43009 Migraine without aura, not intractable, without status migrainosus: Secondary | ICD-10-CM | POA: Diagnosis not present

## 2016-07-12 DIAGNOSIS — IMO0001 Reserved for inherently not codable concepts without codable children: Secondary | ICD-10-CM

## 2016-07-12 DIAGNOSIS — F5101 Primary insomnia: Secondary | ICD-10-CM | POA: Diagnosis not present

## 2016-07-12 DIAGNOSIS — R030 Elevated blood-pressure reading, without diagnosis of hypertension: Secondary | ICD-10-CM | POA: Diagnosis not present

## 2016-07-12 MED ORDER — TRAZODONE HCL 50 MG PO TABS: 25.0000 mg | ORAL_TABLET | Freq: Every evening | ORAL | 5 refills | Status: DC | PRN

## 2016-07-12 MED ORDER — PROMETHAZINE HCL 25 MG PO TABS: 25.0000 mg | ORAL_TABLET | Freq: Three times a day (TID) | ORAL | 2 refills | Status: DC | PRN

## 2016-07-12 MED ORDER — RIZATRIPTAN BENZOATE 10 MG PO TBDP: 10.0000 mg | ORAL_TABLET | ORAL | 5 refills | Status: DC | PRN

## 2016-07-12 MED ORDER — PROMETHAZINE HCL 12.5 MG RE SUPP: 12.5000 mg | Freq: Four times a day (QID) | RECTAL | 0 refills | Status: DC | PRN

## 2016-07-12 NOTE — Progress Notes (Signed)
Subjective:     Patient ID: Jason Luna, male   DOB: [birth year], 50 y.o.   MRN: [id number]  HPI Medical follow-up. He has history of migraine headaches. Generally having about one migraine in 2 months. He is requesting refills of Phenergan tablets and suppositories. He takes the suppositories if he has vomiting with his migraines. He has generally gotten good acute relief with Maxalt 10 mg and requesting refills. He is not sure of exact triggers but possibly stress related. Also has long history of intermittent insomnia  He has done well the past with trazodone and requesting refills. Sometimes takes over-the-counter melatonin. Minimal caffeine use. No regular alcohol use.  History of tonsillar cancer back in 2012. No recent concerns for any recurrence.  Past Medical History:  Diagnosis Date  . Acute appendicitis 07/05/2012  . Allergy   . Anxiety and depression   . Fatigue 05/08/2014  . Gastritis   . GERD (gastroesophageal reflux disease)   . Headache(784.0)   . History of radiation therapy 07/01/11 -08/19/11   right tonsil- 7000 cGy 35 sessions  . History of throat cancer 07/05/2012   He's had radiation and chemotherapies   . Hx of migraines 07/05/2012  . Migraines   . 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 Nasal septal perforation    hx of chronic  . Oropharynx cancer ([place]) 2012   HPV positive-tx chemo-radiation  . Weight loss, abnormal 2012   PEG tube placement Oct 2012   Past Surgical History:  Procedure Laterality Date  . ESOPHAGEAL DILATION  09/11/2012   Procedure: ESOPHAGEAL DILATION;  Surgeon: Rozetta Nunnery, MD;  Location: [place];  Service: ENT;  Laterality: Bilateral;  ESOPHAGOSCOPY WITH SAVORY DILATORS   . ESOPHAGEAL DILATION N/A 04/17/2013   Procedure: ESOPHAGEAL DILATION;  Surgeon: Rozetta Nunnery, MD;  Location: [place];  Service: ENT;  Laterality: N/A;  . ESOPHAGEAL DILATION N/A 01/22/2014   Procedure:  ESOPHAGEAL DILATION WITH THE SAVORY DILATORS;  Surgeon: Rozetta Nunnery, MD;  Location: [place];  Service: ENT;  Laterality: N/A;  . LAPAROSCOPIC APPENDECTOMY  07/05/2012   Procedure: APPENDECTOMY LAPAROSCOPIC;  Surgeon: Joyice Faster. Cornett, MD;  Location: [facility];  Service: General;  Laterality: N/A;  . NASAL SINUS SURGERY    . PEG TUBE PLACEMENT  08/09/11 at [facility] IR   peg out now-2014  . PEG TUBE REMOVAL      reports that he has never smoked. He has never used smokeless tobacco. He reports that he drinks alcohol. He reports that he uses drugs. family history includes Asthma in his other; Cancer in his father and other; GI problems in his mother; Leukemia in his mother. Allergies  Allergen Reactions  . Naproxen Sodium Anaphylaxis  . Other Other (See Comments)    headache  . Bee Venom Swelling  . Zofran [Ondansetron Hcl]      Review of Systems  Constitutional: Negative for fatigue.  Eyes: Negative for visual disturbance.  Respiratory: Negative for cough, chest tightness and shortness of breath.   Cardiovascular: Negative for chest pain, palpitations and leg swelling.  Neurological: Positive for headaches. Negative for dizziness, syncope, weakness and light-headedness.  Hematological: Negative for adenopathy.       Objective:   Physical Exam  Constitutional: He is oriented to person, place, and time. He appears well-developed and well-nourished.  Cardiovascular: Normal rate and regular rhythm.   No murmur heard. Pulmonary/Chest: Effort normal and breath sounds normal. No respiratory  distress. He has no wheezes. He has no rales.  Musculoskeletal: He exhibits no edema.  Neurological: He is alert and oriented to person, place, and time. No cranial nerve deficit.       Assessment:     #1 migraine headaches-overall stable and frequency and severity  #2 chronic intermittent insomnia  #3 borderline elevated blood pressure    Plan:     -Monitor blood pressure  and be in touch if consistently greater than 140/90 -Refill Maxalt MLT 10 mg use as directed when necessary -Refill Phenergan suppositories and tablets to use as needed for nausea -Sleep hygiene discussed -Refill trazodone 50 mg daily at bedtime when necessary insomnia  Eulas Post MD [facility]

## 2016-07-12 NOTE — Patient Instructions (Signed)
Monitor blood pressure and be in touch if consistently > 140/90.   

## 2016-07-12 NOTE — Progress Notes (Signed)
Pre visit review using our clinic review tool, if applicable. No additional management support is needed unless otherwise documented below in the visit note. 

## 2016-07-23 ENCOUNTER — Encounter: Payer: Self-pay | Admitting: Adult Health

## 2016-07-23 ENCOUNTER — Ambulatory Visit (INDEPENDENT_AMBULATORY_CARE_PROVIDER_SITE_OTHER): Payer: 59 | Admitting: Adult Health

## 2016-07-23 VITALS — BP 118/72 | Temp 98.2°F | Ht 69.0 in | Wt 166.6 lb

## 2016-07-23 DIAGNOSIS — G4700 Insomnia, unspecified: Secondary | ICD-10-CM | POA: Diagnosis not present

## 2016-07-23 DIAGNOSIS — R21 Rash and other nonspecific skin eruption: Secondary | ICD-10-CM

## 2016-07-23 MED ORDER — METHYLPREDNISOLONE ACETATE 80 MG/ML IJ SUSP
80.0000 mg | Freq: Once | INTRAMUSCULAR | Status: AC
  Administered 2016-07-23: 80 mg via INTRAMUSCULAR

## 2016-07-23 MED ORDER — CLOTRIMAZOLE-BETAMETHASONE 1-0.05 % EX CREA: 1.0000 "application " | TOPICAL_CREAM | Freq: Two times a day (BID) | CUTANEOUS | 0 refills | Status: DC

## 2016-07-23 MED ORDER — AMITRIPTYLINE HCL 25 MG PO TABS: 25.0000 mg | ORAL_TABLET | Freq: Every day | ORAL | 3 refills | Status: DC

## 2016-07-23 NOTE — Patient Instructions (Signed)
It was great meeting you today   I have sent in a prescription for Elavil. Take one tablet at night to help you sleep. You can take two pills if needed  Continue to apply the anti fungal cream to your rash. Follow up if no improvement

## 2016-07-23 NOTE — Progress Notes (Signed)
Subjective:    Patient ID: Jason Luna, male    DOB: 1966/04/02, 50 y.o.   MRN: [id number]  HPI  50 year old male who presents to the office today for two acute issues  1. Insomnia. - He saw Dr Elease Hashimoto on 07/12/2016 and was prescribed Trazodone to help him sleep ( which he has taken in the past), he reports that after taking this medication he had a migraine headache for three days. This headache " felt different" than his usual migraines. Once he stopped taking Trazodone the headaches resolved. He would like try something else for insomnia.   He is also complaining of of a  Painful, burning rash around his rectum and onto his buttocks. He has been placing prescription anti fungal and OTC hydrocortisone cream on the rash as well as using Epsom salt baths. He reports that these sooth his rash for a short time but that the pain and burning returns. He reports that it is hard for him to sit down. Denies any pain with bowel movements.    Review of Systems  Constitutional: Negative.   Gastrointestinal: Negative.   Genitourinary: Negative.   Skin: Positive for color change and rash.  Neurological: Positive for headaches (resolved ).  All other systems reviewed and are negative.  Past Medical History:  Diagnosis Date  . Acute appendicitis 07/05/2012  . Allergy   . Anxiety and depression   . Fatigue 05/08/2014  . Gastritis   . GERD (gastroesophageal reflux disease)   . Headache(784.0)   . History of radiation therapy 07/01/11 -08/19/11   right tonsil- 7000 cGy 35 sessions  . History of throat cancer 07/05/2012   He's had radiation and chemotherapies   . Hx of migraines 07/05/2012  . Migraines   . 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 Nasal septal perforation    hx of chronic  . 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 Weight loss, abnormal 2012   PEG tube placement Oct 2012    Social History   Social History  . Marital status: Divorced      Spouse name: N/A  . Number of children: N/A  . Years of education: N/A   Occupational History  . Not on file.   Social History Main Topics  . Smoking status: Never Smoker  . Smokeless tobacco: Never Used  . Alcohol use Yes     Comment: 1 - 2 ALCOHOLIC BEVERAGES WEEKLY  . Drug use:      Comment: COCAINE USE OVER 10 YRS AGO  . Sexual activity: Not on file   Other Topics Concern  . Not on file   Social History Narrative  . No narrative on file    Past Surgical History:  Procedure Laterality Date  . ESOPHAGEAL DILATION  09/11/2012   Procedure: ESOPHAGEAL DILATION;  Surgeon: Rozetta Nunnery, MD;  Location: [facility];  Service: ENT;  Laterality: Bilateral;  ESOPHAGOSCOPY WITH SAVORY DILATORS   . ESOPHAGEAL DILATION N/A 04/17/2013   Procedure: ESOPHAGEAL DILATION;  Surgeon: Rozetta Nunnery, MD;  Location: [facility];  Service: ENT;  Laterality: N/A;  . ESOPHAGEAL DILATION N/A 01/22/2014   Procedure: ESOPHAGEAL DILATION WITH THE SAVORY DILATORS;  Surgeon: Rozetta Nunnery, MD;  Location: [facility];  Service: ENT;  Laterality: N/A;  . LAPAROSCOPIC APPENDECTOMY  07/05/2012   Procedure: APPENDECTOMY LAPAROSCOPIC;  Surgeon: Joyice Faster. Cornett, MD;  Location: [facility];  Service: General;  Laterality: N/A;  . NASAL SINUS SURGERY    . PEG TUBE PLACEMENT  08/09/11 at [facility]   peg out now-2014  . PEG TUBE REMOVAL      Family History  Problem Relation Age of Onset  . GI problems Mother     crohns  . Asthma Other   . Cancer Other     prostate,skin  . Leukemia Mother     ALL age 25  . Cancer Father     pancreatic    Allergies  Allergen Reactions  . Naproxen Sodium Anaphylaxis  . Other Other (See Comments)    headache  . Bee Venom Swelling  . Zofran [Ondansetron Hcl]     Current Outpatient Prescriptions on File Prior to Visit  Medication Sig Dispense Refill  . cevimeline (EVOXAC) 30 MG capsule Take 30 mg by mouth 3  (three) times daily.    . divalproex (DEPAKOTE) 250 MG DR tablet take 1 tablet by mouth once daily 100 tablet 2  . EPINEPHrine (EPIPEN) 0.3 mg/0.3 mL SOAJ injection Inject 0.3 mLs (0.3 mg total) into the muscle once. 2 Device 3  . LORazepam (ATIVAN) 1 MG tablet take 1 tablet by mouth if needed for anxiety 60 tablet 5  . omeprazole (PRILOSEC) 20 MG capsule Take 1 capsule (20 mg total) by mouth 2 (two) times daily. 60 capsule 1  . promethazine (PHENERGAN) 12.5 MG suppository Place 1 suppository (12.5 mg total) rectally every 6 (six) hours as needed for nausea or vomiting. 12 each 0  . promethazine (PHENERGAN) 25 MG tablet Take 1 tablet (25 mg total) by mouth every 8 (eight) hours as needed for nausea or vomiting. 30 tablet 2  . rizatriptan (MAXALT-MLT) 10 MG disintegrating tablet Take 1 tablet (10 mg total) by mouth as needed for migraine. May repeat in 2 hours if needed 10 tablet 5  . triamcinolone (KENALOG) 0.025 % cream   0  . traZODone (DESYREL) 50 MG tablet Take 0.5-1 tablets (25-50 mg total) by mouth at bedtime as needed for sleep. (Patient not taking: Reported on 07/23/2016) 30 tablet 5   No current facility-administered medications on file prior to visit.     BP 118/72   Temp 98.2 F (36.8 C) (Oral)   Ht 5\' 9"  (1.753 m)   Wt 166 lb 9.6 oz (75.6 kg)   BMI 24.60 kg/m       Objective:   Physical Exam  Constitutional: He is oriented to person, place, and time. He appears well-developed and well-nourished. No distress.  Eyes: Conjunctivae and EOM are normal. Pupils are equal, round, and reactive to light. Right eye exhibits no discharge. Left eye exhibits no discharge. No scleral icterus.  Neurological: He is alert and oriented to person, place, and time.  Skin: Skin is warm and dry. He is not diaphoretic. There is erythema.  Has a red diffuse rash between buttocks. No crusting noted  Psychiatric: He has a normal mood and affect. His behavior is normal. Judgment and thought content  normal.  Nursing note and vitals reviewed.     Assessment & Plan:  1. Insomnia - D/C Trazodone - amitriptyline (ELAVIL) 25 MG tablet; Take 1 tablet (25 mg total) by mouth at bedtime.  Dispense: 30 tablet; Refill: 3 - He can talk to his PCP about Ambien or lunesta.   2. Rash and nonspecific skin eruption - Appears as jock itch.  - clotrimazole-betamethasone (LOTRISONE) cream; Apply 1 application topically 2 (two) times daily.  Dispense: 30 g;  Refill: 0 - methylPREDNISolone acetate (DEPO-MEDROL) injection 80 mg; Inject 1 mL (80 mg total) into the muscle once.   [employer]

## 2016-10-29 ENCOUNTER — Ambulatory Visit (INDEPENDENT_AMBULATORY_CARE_PROVIDER_SITE_OTHER): Payer: 59 | Admitting: Physician Assistant

## 2016-10-29 VITALS — BP 90/62 | HR 99 | Temp 98.6°F | Resp 16 | Ht 69.0 in | Wt 173.0 lb

## 2016-10-29 DIAGNOSIS — R6889 Other general symptoms and signs: Secondary | ICD-10-CM | POA: Diagnosis not present

## 2016-10-29 MED ORDER — OSELTAMIVIR PHOSPHATE 75 MG PO CAPS: 75.0000 mg | ORAL_CAPSULE | Freq: Two times a day (BID) | ORAL | 0 refills | Status: DC

## 2016-10-29 NOTE — Progress Notes (Signed)
[facility] [address] [place] [address] [phone]  Date:  10/29/2016   Name:  Jason Luna   DOB:  12/22/1965   MRN:  [id number]  PCP:  Joycelyn Man, MD    History of Present Illness:  Jason Luna is a 50 y.o. male patient who presents to [facility] for cc of headache.   2 days ago, felt fatigued.  This morning, woke at 4:30 with bodyaches and fever of 101.6.  No coughing.  No congestion.  Headache.  No sob or dyspnea.  There is weakness.  Felt some queasiness.  No nausea or vomiting.  No diarrhea.  No dizziness.  He reports that his bp generally runs low.  Patient Active Problem List   Diagnosis Date Noted  . Influenza-like illness 01/23/2016  . Influenza with respiratory manifestations 02/25/2015  . Insomnia 09/05/2014  . Dysphagia 05/09/2014  . Fatigue 05/08/2014  . 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 Allergy   . Migraines   . GERD (gastroesophageal reflux disease)   . History of throat cancer 07/05/2012  . History of radiation therapy   . Oropharynx cancer ([place])   . IRRITABLE BOWEL SYNDROME 11/08/2008  . ALLERGIC RHINITIS 06/23/2007    Past Medical History:  Diagnosis Date  . Acute appendicitis 07/05/2012  . Allergy   . Anxiety and depression   . Fatigue 05/08/2014  . Gastritis   . GERD (gastroesophageal reflux disease)   . Headache(784.0)   . History of radiation therapy 07/01/11 -08/19/11   right tonsil- 7000 cGy 35 sessions  . History of throat cancer 07/05/2012   He's had radiation and chemotherapies   . Hx of migraines 07/05/2012  . Migraines   . 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 Nasal septal perforation    hx of chronic  . 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 Weight loss, abnormal 2012   PEG tube placement Oct 2012    Past Surgical History:  Procedure Laterality Date  . ESOPHAGEAL DILATION  09/11/2012   Procedure:  ESOPHAGEAL DILATION;  Surgeon: Rozetta Nunnery, MD;  Location: [facility];  Service: ENT;  Laterality: Bilateral;  ESOPHAGOSCOPY WITH SAVORY DILATORS   . ESOPHAGEAL DILATION N/A 04/17/2013   Procedure: ESOPHAGEAL DILATION;  Surgeon: Rozetta Nunnery, MD;  Location: [facility];  Service: ENT;  Laterality: N/A;  . ESOPHAGEAL DILATION N/A 01/22/2014   Procedure: ESOPHAGEAL DILATION WITH THE SAVORY DILATORS;  Surgeon: Rozetta Nunnery, MD;  Location: [facility];  Service: ENT;  Laterality: N/A;  . LAPAROSCOPIC APPENDECTOMY  07/05/2012   Procedure: APPENDECTOMY LAPAROSCOPIC;  Surgeon: Joyice Faster. Cornett, MD;  [facility];  Service: General;  Laterality: N/A;  . NASAL SINUS SURGERY    . PEG TUBE PLACEMENT  08/09/11 at [facility]   peg out now-2014  . PEG TUBE REMOVAL      Social History  Substance Use Topics  . Smoking status: Never Smoker  . Smokeless tobacco: Never Used  . Alcohol use Yes     Comment: 1 - 2 ALCOHOLIC BEVERAGES WEEKLY    Family History  Problem Relation Age of Onset  . GI problems Mother     crohns  . Asthma Other   . Cancer Other     prostate,skin  . Leukemia Mother     ALL age 63  . Cancer Father  pancreatic    Allergies  Allergen Reactions  . Naproxen Sodium Anaphylaxis  . Other Other (See Comments)    headache  . Bee Venom Swelling  . Zofran [Ondansetron Hcl]     Medication list has been reviewed and updated.  Current Outpatient Prescriptions on File Prior to Visit  Medication Sig Dispense Refill  . amitriptyline (ELAVIL) 25 MG tablet Take 1 tablet (25 mg total) by mouth at bedtime. 30 tablet 3  . cevimeline (EVOXAC) 30 MG capsule Take 30 mg by mouth 3 (three) times daily.    . clotrimazole-betamethasone (LOTRISONE) cream Apply 1 application topically 2 (two) times daily. 30 g 0  . EPINEPHrine (EPIPEN) 0.3 mg/0.3 mL SOAJ injection Inject 0.3 mLs (0.3 mg total) into the muscle once. 2 Device 3  .  LORazepam (ATIVAN) 1 MG tablet take 1 tablet by mouth if needed for anxiety 60 tablet 5  . omeprazole (PRILOSEC) 20 MG capsule Take 1 capsule (20 mg total) by mouth 2 (two) times daily. 60 capsule 1  . promethazine (PHENERGAN) 12.5 MG suppository Place 1 suppository (12.5 mg total) rectally every 6 (six) hours as needed for nausea or vomiting. 12 each 0  . promethazine (PHENERGAN) 25 MG tablet Take 1 tablet (25 mg total) by mouth every 8 (eight) hours as needed for nausea or vomiting. 30 tablet 2  . rizatriptan (MAXALT-MLT) 10 MG disintegrating tablet Take 1 tablet (10 mg total) by mouth as needed for migraine. May repeat in 2 hours if needed 10 tablet 5  . triamcinolone (KENALOG) 0.025 % cream   0  . divalproex (DEPAKOTE) 250 MG DR tablet take 1 tablet by mouth once daily (Patient not taking: Reported on 10/29/2016) 100 tablet 2   No current facility-administered medications on file prior to visit.     ROS ROS otherwise unremarkable unless listed above.   Physical Examination: BP (!) 90/58 (BP Location: Right Arm, Patient Position: Sitting, Cuff Size: Small)   Pulse (!) 107   Temp 98.6 F (37 C) (Oral)   Resp 16   Ht 5\' 9"  (1.753 m)   Wt 173 lb (78.5 kg)   SpO2 98%   BMI 25.55 kg/m  Ideal Body Weight: Weight in (lb) to have BMI = 25: 168.9  Physical Exam  Constitutional: He is oriented to person, place, and time. He appears well-developed and well-nourished. No distress.  HENT:  Head: Atraumatic.  Right Ear: Tympanic membrane, external ear and ear canal normal.  Left Ear: Tympanic membrane, external ear and ear canal normal.  Nose: Mucosal edema and rhinorrhea present. Right sinus exhibits no maxillary sinus tenderness and no frontal sinus tenderness. Left sinus exhibits no maxillary sinus tenderness and no frontal sinus tenderness.  Mouth/Throat: No uvula swelling. No oropharyngeal exudate, posterior oropharyngeal edema or posterior oropharyngeal erythema.  Eyes: Conjunctivae,  EOM and lids are normal. Pupils are equal, round, and reactive to light. Right eye exhibits normal extraocular motion. Left eye exhibits normal extraocular motion.  Neck: Trachea normal and full passive range of motion without pain. No edema and no erythema present.  Cardiovascular: Normal rate.   Pulmonary/Chest: Effort normal. No respiratory distress. He has no decreased breath sounds. He has no wheezes. He has no rhonchi.  Neurological: He is alert and oriented to person, place, and time.  Skin: Skin is warm and dry. He is not diaphoretic.  Psychiatric: He has a normal mood and affect. His behavior is normal.     Assessment and Plan: DELJUAN BLAZ is  a 50 y.o. male who is here today for cc headache and ur symptoms. Flu-like symptoms - Plan: oseltamivir (TAMIFLU) 75 MG capsule   Ivar Drape, PA-C [facility] 10/29/2016 2:31 PM

## 2016-10-29 NOTE — Patient Instructions (Addendum)
  Influenza, Adult Influenza ("the flu") is an infection in the lungs, nose, and throat (respiratory tract). It is caused by a virus. The flu causes many common cold symptoms, as well as a high fever and body aches. It can make you feel very sick. The flu spreads easily from person to person (is contagious). Getting a flu shot (influenza vaccination) every year is the best way to prevent the flu. Follow these instructions at home:  Take over-the-counter and prescription medicines only as told by your doctor.  Use a cool mist humidifier to add moisture (humidity) to the air in your home. This can make it easier to breathe.  Rest as needed.  Drink enough fluid to keep your pee (urine) clear or pale yellow.  Cover your mouth and nose when you cough or sneeze.  Wash your hands with soap and water often, especially after you cough or sneeze. If you cannot use soap and water, use hand sanitizer.  Stay home from work or school as told by your doctor. Unless you are visiting your doctor, try to avoid leaving home until your fever has been gone for 24 hours without the use of medicine.  Keep all follow-up visits as told by your doctor. This is important. How is this prevented?  Getting a yearly (annual) flu shot is the best way to avoid getting the flu. You may get the flu shot in late summer, fall, or winter. Ask your doctor when you should get your flu shot.  Wash your hands often or use hand sanitizer often.  Avoid contact with people who are sick during cold and flu season.  Eat healthy foods.  Drink plenty of fluids.  Get enough sleep.  Exercise regularly. Contact a doctor if:  You get new symptoms.  You have:  Chest pain.  Watery poop (diarrhea).  A fever.  Your cough gets worse.  You start to have more mucus.  You feel sick to your stomach (nauseous).  You throw up (vomit). Get help right away if:  You start to be short of breath or have trouble  breathing.  Your skin or nails turn a bluish color.  You have very bad pain or stiffness in your neck.  You get a sudden headache.  You get sudden pain in your face or ear.  You cannot stop throwing up. This information is not intended to replace advice given to you by your health care provider. Make sure you discuss any questions you have with your health care provider. Document Released: 07/27/2008 Document Revised: 03/25/2016 Document Reviewed: 08/12/2015 Elsevier Interactive Patient Education  2017 [employer].     IF you received an x-ray today, you will receive an invoice from [facility]. Please contact [facility] at [phone] with questions or concerns regarding your invoice.   IF you received labwork today, you will receive an invoice from [facility]. Please contact [facility] at [phone] with questions or concerns regarding your invoice.   Our billing staff will not be able to assist you with questions regarding bills from these companies.  You will be contacted with the lab results as soon as they are available. The fastest way to get your results is to activate your My Chart account. Instructions are located on the last page of this paperwork. If you have not heard from [place] regarding the results in 2 weeks, please contact this office.

## 2016-11-11 DIAGNOSIS — L218 Other seborrheic dermatitis: Secondary | ICD-10-CM | POA: Diagnosis not present

## 2017-06-21 DIAGNOSIS — E291 Testicular hypofunction: Secondary | ICD-10-CM | POA: Diagnosis not present

## 2017-06-29 DIAGNOSIS — E291 Testicular hypofunction: Secondary | ICD-10-CM | POA: Diagnosis not present

## 2017-06-29 DIAGNOSIS — Z125 Encounter for screening for malignant neoplasm of prostate: Secondary | ICD-10-CM | POA: Diagnosis not present

## 2017-08-02 DIAGNOSIS — J310 Chronic rhinitis: Secondary | ICD-10-CM | POA: Diagnosis not present

## 2017-08-15 ENCOUNTER — Telehealth: Payer: Self-pay | Admitting: *Deleted

## 2017-08-15 NOTE — Telephone Encounter (Signed)
On 08-15-17 fax medical records to [facility], it was consult note, end of tx note, follow up note

## 2017-11-04 ENCOUNTER — Ambulatory Visit: Payer: Self-pay | Admitting: Internal Medicine

## 2017-11-04 ENCOUNTER — Encounter: Payer: Self-pay | Admitting: Family Medicine

## 2017-11-04 ENCOUNTER — Ambulatory Visit: Payer: 59 | Admitting: Family Medicine

## 2017-11-04 ENCOUNTER — Telehealth: Payer: Self-pay | Admitting: Emergency Medicine

## 2017-11-04 VITALS — BP 110/70 | HR 77 | Temp 98.0°F | Wt 173.7 lb

## 2017-11-04 DIAGNOSIS — J329 Chronic sinusitis, unspecified: Secondary | ICD-10-CM | POA: Diagnosis not present

## 2017-11-04 DIAGNOSIS — J069 Acute upper respiratory infection, unspecified: Secondary | ICD-10-CM | POA: Diagnosis not present

## 2017-11-04 DIAGNOSIS — B9789 Other viral agents as the cause of diseases classified elsewhere: Secondary | ICD-10-CM

## 2017-11-04 NOTE — Patient Instructions (Addendum)

## 2017-11-04 NOTE — Progress Notes (Signed)
Subjective:    Patient ID: Jason Luna, male    DOB: [birth year], 52 y.o.   MRN: [id number]  No chief complaint on file.   HPI Patient was seen today for acute visit.  Patient with cough, sinus pressure/nasal congestion, clammy feeling since Wednesday evening.  Patient denies sore throat.  Patient has tried Mucinex, NyQuil.  Sick contacts include his girlfriend.  Past Medical History:  Diagnosis Date  . Acute appendicitis 07/05/2012  . Allergy   . Anxiety and depression   . Fatigue 05/08/2014  . Gastritis   . GERD (gastroesophageal reflux disease)   . Headache(784.0)   . History of radiation therapy 07/01/11 -08/19/11   right tonsil- 7000 cGy 35 sessions  . History of throat cancer 07/05/2012   He's had radiation and chemotherapies   . Hx of migraines 07/05/2012  . Migraines   . 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 Nasal septal perforation    hx of chronic  . 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 Weight loss, abnormal 2012   PEG tube placement Oct 2012    Allergies  Allergen Reactions  . Naproxen Sodium Anaphylaxis  . Other Other (See Comments)    headache  . Bee Venom Swelling  . Zofran [Ondansetron Hcl]     ROS General: Denies fever, chills, night sweats, changes in weight, changes in appetite HEENT: Denies headaches, ear pain, changes in vision, rhinorrhea, sore throat  +nasal congestion/pressure CV: Denies CP, palpitations, SOB, orthopnea Pulm: Denies SOB, wheezing  +cough GI: Denies abdominal pain, nausea, vomiting, diarrhea, constipation GU: Denies dysuria, hematuria, frequency, vaginal discharge Msk: Denies muscle cramps, joint pains Neuro: Denies weakness, numbness, tingling Skin: Denies rashes, bruising Psych: Denies depression, anxiety, hallucinations     Objective:    Blood pressure 110/70, pulse 77, temperature 98 F (36.7 C), temperature source Oral, weight 173 lb 11.2 oz (78.8 kg), SpO2 98 %.   Gen.  Pleasant, well-nourished, in no distress, normal affect  HEENT: [place]/AT, face symmetric, conjunctiva clear, no scleral icterus, PERRLA, EOMI, nares patent with clear drainage, nasal septum partially intact,  pharynx with mild erythema and postnasal drainage, no exudate. Lungs: no accessory muscle use, CTAB, no wheezes or rales Cardiovascular: RRR, no m/r/g, no peripheral edema Neuro:  A&Ox3, CN II-XII intact, normal gait   Wt Readings from Last 3 Encounters:  11/04/17 173 lb 11.2 oz (78.8 kg)  10/29/16 173 lb (78.5 kg)  07/23/16 166 lb 9.6 oz (75.6 kg)    Lab Results  Component Value Date   WBC 5.5 05/08/2014   HGB 13.8 05/08/2014   HCT 41.2 05/08/2014   PLT 216 05/08/2014   GLUCOSE 80 05/08/2014   CHOL 169 08/11/2010   TRIG 209.0 (H) 08/11/2010   HDL 31.90 (L) 08/11/2010   LDLDIRECT 103.0 08/11/2010   ALT 28 05/08/2014   AST 26 05/08/2014   NA 142 05/08/2014   K 4.2 05/08/2014   CL 95 (L) 01/17/2014   CREATININE 1.0 05/08/2014   BUN 24.4 05/08/2014   CO2 28 05/08/2014   TSH 2.775 05/08/2014   INR 0.90 08/09/2011    Assessment/Plan:  Viral URI with cough -Supportive care -Okay to continue Afrin use, however patient advised Afrin has a 5 night limit. -Pt advised if develops a sore throat can gargle with warm salt water or Chloraseptic spray. -Okay to use saline nasal spray. -P.o. decongestants also advised -Follow-up PRN    Grier Mitts, MD

## 2017-11-04 NOTE — Telephone Encounter (Signed)
Patient states he needs a refill for Lotrisone, Promethazine, Maxalt-MLT. Please advise. Thank you.

## 2017-11-07 ENCOUNTER — Other Ambulatory Visit: Payer: Self-pay

## 2017-11-07 DIAGNOSIS — R21 Rash and other nonspecific skin eruption: Secondary | ICD-10-CM

## 2017-11-07 MED ORDER — RIZATRIPTAN BENZOATE 10 MG PO TBDP: 10.0000 mg | ORAL_TABLET | ORAL | 5 refills | Status: AC | PRN

## 2017-11-07 MED ORDER — CLOTRIMAZOLE-BETAMETHASONE 1-0.05 % EX CREA: 1.0000 "application " | TOPICAL_CREAM | Freq: Two times a day (BID) | CUTANEOUS | 3 refills | Status: AC

## 2017-11-07 MED ORDER — PROMETHAZINE HCL 25 MG PO TABS: 25.0000 mg | ORAL_TABLET | Freq: Three times a day (TID) | ORAL | 2 refills | Status: AC | PRN

## 2017-11-07 MED ORDER — PROMETHAZINE HCL 12.5 MG RE SUPP: 12.5000 mg | Freq: Four times a day (QID) | RECTAL | 1 refills | Status: DC | PRN

## 2017-11-07 NOTE — Telephone Encounter (Signed)
Pt Rx requests have been sent to the pharmacy for refill.

## 2017-12-20 DIAGNOSIS — E291 Testicular hypofunction: Secondary | ICD-10-CM | POA: Diagnosis not present

## 2018-05-03 ENCOUNTER — Telehealth: Payer: Self-pay | Admitting: Family Medicine

## 2018-05-03 ENCOUNTER — Ambulatory Visit: Payer: 59 | Admitting: Internal Medicine

## 2018-05-03 ENCOUNTER — Other Ambulatory Visit: Payer: Self-pay | Admitting: Family Medicine

## 2018-05-03 ENCOUNTER — Encounter: Payer: Self-pay | Admitting: Internal Medicine

## 2018-05-03 VITALS — BP 127/90 | HR 75 | Temp 98.7°F

## 2018-05-03 DIAGNOSIS — Z79899 Other long term (current) drug therapy: Secondary | ICD-10-CM

## 2018-05-03 DIAGNOSIS — G43019 Migraine without aura, intractable, without status migrainosus: Secondary | ICD-10-CM

## 2018-05-03 MED ORDER — PROMETHAZINE HCL 25 MG RE SUPP: 25.0000 mg | Freq: Four times a day (QID) | RECTAL | 1 refills | Status: AC | PRN

## 2018-05-03 MED ORDER — BUTALBITAL-APAP-CAFF-COD 50-325-40-30 MG PO CAPS: 1.0000 | ORAL_CAPSULE | Freq: Four times a day (QID) | ORAL | 0 refills | Status: DC | PRN

## 2018-05-03 MED ORDER — METHYLPREDNISOLONE ACETATE 80 MG/ML IJ SUSP
120.0000 mg | Freq: Once | INTRAMUSCULAR | Status: AC
  Administered 2018-05-03: 120 mg via INTRAMUSCULAR

## 2018-05-03 MED ORDER — BUTALBITAL-APAP-CAFF-COD 50-325-40-30 MG PO CAPS: 1.0000 | ORAL_CAPSULE | Freq: Four times a day (QID) | ORAL | 0 refills | Status: AC | PRN

## 2018-05-03 NOTE — Telephone Encounter (Signed)
Copied from [place] [id number]. Topic: Quick Communication - See Telephone Encounter >> May 03, 2018  4:01 PM Ivar Drape wrote: CRM for notification. See Telephone encounter for: 05/03/18. Patient was in to see the provider today and was called in a prescription for butalbital-acetaminophen-caffeine (FIORICET/CODEINE) 50-325-40-30 MG capsule medication to [facility] on [address], but they don't have it in stock.  So the patient would like to come by and pick up a paper copy of the prescription today before closing.

## 2018-05-03 NOTE — Patient Instructions (Addendum)
Apologies we do not have injectable promethezine  In the office   Sending in  suppositiries    25 and depmedrol to try and  Abort the headache .    Can take  fioricet c codiene  with caution    Risk  Medicine to hope  of this headache .   If ongoing then  May need to seek care in ED .

## 2018-05-03 NOTE — Telephone Encounter (Signed)
Called pt & left a message to call the office.

## 2018-05-03 NOTE — Progress Notes (Signed)
Chief Complaint  Patient presents with  . Migraine    HPI: Jason Luna 52 y.o. come in for SDA PCP NA  maxalt   didnt work has had 2 doses in past 24 hours   No fever  . Failed initlal rx   Because stated   typical  Ha and  Vomiting   No fever   Usually controlled with phenergan and  maxalt etc . But this one going on for  Overa day  Here with family  Has had stress recently  caretaking .     Feels like   A typical migraine x severe.    ROS: See pertinent positives and negatives per HPI. No  neuro sx  Atypical and no fever.   Past Medical History:  Diagnosis Date  . Acute appendicitis 07/05/2012  . Allergy   . Anxiety and depression   . Fatigue 05/08/2014  . Gastritis   . GERD (gastroesophageal reflux disease)   . Headache(784.0)   . History of radiation therapy 07/01/11 -08/19/11   right tonsil- 7000 cGy 35 sessions  . History of throat cancer 07/05/2012   He's had radiation and chemotherapies   . Hx of migraines 07/05/2012  . Migraines   . 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 Nasal septal perforation    hx of chronic  . 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 Weight loss, abnormal 2012   PEG tube placement Oct 2012    Family History  Problem Relation Age of Onset  . GI problems Mother        crohns  . Asthma Other   . Cancer Other        prostate,skin  . Leukemia Mother        ALL age 53  . Cancer Father        pancreatic    Social History   Socioeconomic History  . Marital status: Divorced    Spouse name: Not on file  . Number of children: Not on file  . Years of education: Not on file  . Highest education level: Not on file  Occupational History  . Not on file  Social Needs  . Financial resource strain: Not on file  . Food insecurity:    Worry: Not on file    Inability: Not on file  . Transportation needs:    Medical: Not on file    Non-medical: Not on file  Tobacco Use  . Smoking status: Never Smoker   . Smokeless tobacco: Never Used  Substance and Sexual Activity  . Alcohol use: Yes    Comment: 1 - 2 ALCOHOLIC BEVERAGES WEEKLY  . Drug use: Yes    Comment: COCAINE USE OVER 10 YRS AGO  . Sexual activity: Not on file  Lifestyle  . Physical activity:    Days per week: Not on file    Minutes per session: Not on file  . Stress: Not on file  Relationships  . Social connections:    Talks on phone: Not on file    Gets together: Not on file    Attends religious service: Not on file    Active member of club or organization: Not on file    Attends meetings of clubs or organizations: Not on file    Relationship status: Not on file  Other Topics Concern  . Not on file  Social History Narrative  . Not on file    Outpatient Medications Prior  to Visit  Medication Sig Dispense Refill  . cevimeline (EVOXAC) 30 MG capsule Take 30 mg by mouth 3 (three) times daily.    Marland Kitchen omeprazole (PRILOSEC) 20 MG capsule Take 1 capsule (20 mg total) by mouth 2 (two) times daily. 60 capsule 1  . promethazine (PHENERGAN) 12.5 MG suppository Place 1 suppository (12.5 mg total) rectally every 6 (six) hours as needed for nausea or vomiting. 12 each 1  . promethazine (PHENERGAN) 25 MG tablet Take 1 tablet (25 mg total) by mouth every 8 (eight) hours as needed for nausea or vomiting. 30 tablet 2  . rizatriptan (MAXALT-MLT) 10 MG disintegrating tablet Take 1 tablet (10 mg total) by mouth as needed for migraine. May repeat in 2 hours if needed 10 tablet 5  . triamcinolone (KENALOG) 0.025 % cream   0  . amitriptyline (ELAVIL) 25 MG tablet Take 1 tablet (25 mg total) by mouth at bedtime. (Patient not taking: Reported on 05/03/2018) 30 tablet 3  . clotrimazole-betamethasone (LOTRISONE) cream Apply 1 application topically 2 (two) times daily. (Patient not taking: Reported on 05/03/2018) 30 g 3  . divalproex (DEPAKOTE) 250 MG DR tablet take 1 tablet by mouth once daily (Patient not taking: Reported on 05/03/2018) 100 tablet 2  .  EPINEPHrine (EPIPEN) 0.3 mg/0.3 mL SOAJ injection Inject 0.3 mLs (0.3 mg total) into the muscle once. (Patient not taking: Reported on 05/03/2018) 2 Device 3  . LORazepam (ATIVAN) 1 MG tablet take 1 tablet by mouth if needed for anxiety (Patient not taking: Reported on 05/03/2018) 60 tablet 5   No facility-administered medications prior to visit.      EXAM:  BP 127/90   Pulse 75   Temp 98.7 F (37.1 C)   There is no height or weight on file to calculate BMI.  GENERAL: vitals reviewed and listed above,  layin gin dark non focal   oriented and nl speech , appears well hydrated and in no acute distress HEENT: atraumatic, conjunctiva  clear, no obvious abnormalities on inspection of external nose and ears pos photophobia  eoms nl  OP : no lesion edema or exudate .   NECK: no obvious masses on inspection palpation  LUNGS: clear to auscultation bilaterally, no wheezes, rales or rhonchi, good air movement CV: HRRR, no clubbing cyanosis or  peripheral edema nl cap refill  Abdomen:  Sof,t normal bowel sounds without hepatosplenomegaly, no guarding rebound or masses no CVA tenderness MS: moves all extremities without noticeable focal  abnormality PSYCH in distress from headache but nl speech  And non focal neuro.  Lab Results  Component Value Date   WBC 5.5 05/08/2014   HGB 13.8 05/08/2014   HCT 41.2 05/08/2014   PLT 216 05/08/2014   GLUCOSE 80 05/08/2014   CHOL 169 08/11/2010   TRIG 209.0 (H) 08/11/2010   HDL 31.90 (L) 08/11/2010   LDLDIRECT 103.0 08/11/2010   ALT 28 05/08/2014   AST 26 05/08/2014   NA 142 05/08/2014   K 4.2 05/08/2014   CL 95 (L) 01/17/2014   CREATININE 1.0 05/08/2014   BUN 24.4 05/08/2014   CO2 28 05/08/2014   TSH 2.775 05/08/2014   INR 0.90 08/09/2011   BP Readings from Last 3 Encounters:  05/03/18 127/90  11/04/17 110/70  10/29/16 90/62    ASSESSMENT AND PLAN:  Discussed the following assessment and plan:  Intractable migraine without aura and without  status migrainosus - Plan: methylPREDNISolone acetate (DEPO-MEDROL) injection 120 mg  Medication management  Cannot take  nsaids cause of naproxyn allergy.  And zofran      phenergan na in house   After ordered  Send in rx Fioricet codiene # 6  But pharmacy didnt have it so   Printed rx today.   And sent in   Phenergan  25 supp.   -Patient advised to return or notify health care team  if  new concerns arise.  Patient Instructions  Apologies we do not have injectable promethezine  In the office   Sending in  suppositiries    25 and depmedrol to try and  Abort the headache .    Can take  fioricet c codiene  with caution    Risk  Medicine to hope  of this headache .   If ongoing then  May need to seek care in ED .          Standley Brooking. Raelin Pixler M.D.

## 2018-05-08 NOTE — Telephone Encounter (Signed)
This is Dr Sherren Mocha pt please Advise

## 2018-05-09 NOTE — Telephone Encounter (Signed)
Okay I will sign a prescription for #[phone]

## 2018-05-09 NOTE — Telephone Encounter (Signed)
Rx was phoned in to [facility] per pt request. # [phone] with no refills per Dr Raliegh Ip. Pt is aware

## 2018-06-09 ENCOUNTER — Encounter (INDEPENDENT_AMBULATORY_CARE_PROVIDER_SITE_OTHER): Payer: Self-pay | Admitting: Orthopaedic Surgery

## 2018-06-09 ENCOUNTER — Ambulatory Visit (INDEPENDENT_AMBULATORY_CARE_PROVIDER_SITE_OTHER): Payer: Self-pay

## 2018-06-09 ENCOUNTER — Other Ambulatory Visit (INDEPENDENT_AMBULATORY_CARE_PROVIDER_SITE_OTHER): Payer: Self-pay | Admitting: Radiology

## 2018-06-09 ENCOUNTER — Telehealth (INDEPENDENT_AMBULATORY_CARE_PROVIDER_SITE_OTHER): Payer: Self-pay | Admitting: Orthopaedic Surgery

## 2018-06-09 ENCOUNTER — Ambulatory Visit (INDEPENDENT_AMBULATORY_CARE_PROVIDER_SITE_OTHER): Payer: 59 | Admitting: Orthopaedic Surgery

## 2018-06-09 VITALS — BP 131/80 | HR 81 | Ht 69.0 in | Wt 169.0 lb

## 2018-06-09 DIAGNOSIS — M25562 Pain in left knee: Secondary | ICD-10-CM | POA: Diagnosis not present

## 2018-06-09 NOTE — Progress Notes (Signed)
Office Visit Note   Patient: Jason Luna           Date of Birth: 08-03-1966           MRN: [id number] Visit Date: 06/09/2018              Requested by: Dorena Cookey, MD [place], [place] [address] PCP: Dorena Cookey, MD   Assessment & Plan: Visit Diagnoses:  1. Acute pain of left knee     Plan: Acute onset of left knee pain 2 weeks ago with symptoms consistent with tear of the medial meniscus.  The knee is lacking a few degrees to full extension and might have a flipped meniscal segment.  Long discussion regarding diagnostic possibilities.  Would suggest MRI scan  Follow-Up Instructions: Return if symptoms worsen or fail to improve, for MRI left knee.   Orders:  Orders Placed This Encounter  Procedures  . XR KNEE 3 VIEW LEFT  . MR Knee Left w/o contrast   No orders of the defined types were placed in this encounter.     Procedures: No procedures performed   Clinical Data: No additional findings.   Subjective: Chief Complaint  Patient presents with  . New Patient (Initial Visit)    L KNEE PAIN FOR 2 WEEKS WAS BENDING DOWN AND KNEE FELT FUNNY PT WOKE UP NEXT MORNING AND L KNEE WAS SORE ,  NO INJURY  [name] is 52 years old and visited the office with a 2-week history of left knee pain.  Noted initial onset of pain when he was involved in a flexion maneuver to the knee.  He was having some pain that night but by the next morning was "quite sore".  He continues to have pain to the point of ambulatory compromise.  He might of even had some locking sensations and "fluid".  Pain is localized along the posterior medial joint line.  [name] is planning a trip to the [facility] at the end of September  HPI  Review of Systems  Constitutional: Positive for fatigue. Negative for fever.  HENT: Negative for ear pain.   Eyes: Negative for pain.  Respiratory: Negative for cough and shortness of breath.   Cardiovascular: Negative for leg swelling.   Gastrointestinal: Negative for constipation and diarrhea.  Genitourinary: Negative for difficulty urinating.  Musculoskeletal: Negative for back pain and neck pain.  Skin: Negative for rash.  Allergic/Immunologic: Negative for food allergies.  Neurological: Positive for weakness. Negative for numbness.  Hematological: Does not bruise/bleed easily.  Psychiatric/Behavioral: Positive for sleep disturbance.     Objective: Vital Signs: BP 131/80 (BP Location: Left Arm, Patient Position: Sitting, Cuff Size: Normal)   Pulse 81   Ht 5\' 9"  (1.753 m)   Wt 169 lb (76.7 kg)   BMI 24.96 kg/m   Physical Exam  Constitutional: He is oriented to person, place, and time. He appears well-developed and well-nourished.  HENT:  Mouth/Throat: Oropharynx is clear and moist.  Eyes: Pupils are equal, round, and reactive to light. EOM are normal.  Pulmonary/Chest: Effort normal.  Neurological: He is alert and oriented to person, place, and time.  Skin: Skin is warm and dry.  Psychiatric: He has a normal mood and affect. His behavior is normal.    Ortho Exam awake alert and oriented x3.  Comfortable sitting.  Walks with a limp referable to his left knee.  Minimal effusion left knee.  Pain directly along the posterior medial joint  line.  No popping or clicking.  No pain over the MCL or LCL.  Lacked a few degrees to full extension consistent with locking.  No popliteal pain.  No calf pain.  No distal edema.  Neurovascular exam intact Specialty Comments:  No specialty comments available.  Imaging: Xr Knee 3 View Left  Result Date: 06/09/2018 Films of the left knee were obtained in several projections standing.  No acute changes.  There was very minimal decrease in the medial joint space with mild irregularity along the distal femoral joint surface.  No ectopic calcification.  Normal alignment    PMFS History: Patient Active Problem List   Diagnosis Date Noted  . Insomnia 09/05/2014  . Dysphagia  05/09/2014  . Fatigue 05/08/2014  . 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 Allergy   . Migraines   . GERD (gastroesophageal reflux disease)   . History of throat cancer 07/05/2012  . History of radiation therapy   . Oropharynx cancer ([place])   . IRRITABLE BOWEL SYNDROME 11/08/2008  . ALLERGIC RHINITIS 06/23/2007   Past Medical History:  Diagnosis Date  . Acute appendicitis 07/05/2012  . Allergy   . Anxiety and depression   . Fatigue 05/08/2014  . Gastritis   . GERD (gastroesophageal reflux disease)   . Headache(784.0)   . History of radiation therapy 07/01/11 -08/19/11   right tonsil- 7000 cGy 35 sessions  . History of throat cancer 07/05/2012   He's had radiation and chemotherapies   . Hx of migraines 07/05/2012  . Migraines   . 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 Nasal septal perforation    hx of chronic  . 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 Weight loss, abnormal 2012   PEG tube placement Oct 2012    Family History  Problem Relation Age of Onset  . GI problems Mother        crohns  . Leukemia Mother        ALL age 57  . Cancer Father        pancreatic  . Asthma Other   . Cancer Other        prostate,skin    Past Surgical History:  Procedure Laterality Date  . ESOPHAGEAL DILATION  09/11/2012   Procedure: ESOPHAGEAL DILATION;  Surgeon: Rozetta Nunnery, MD;  Location: [facility];  Service: ENT;  Laterality: Bilateral;  ESOPHAGOSCOPY WITH SAVORY DILATORS   . ESOPHAGEAL DILATION N/A 04/17/2013   Procedure: ESOPHAGEAL DILATION;  Surgeon: Rozetta Nunnery, MD;  Location: [facility];  Service: ENT;  Laterality: N/A;  . ESOPHAGEAL DILATION N/A 01/22/2014   Procedure: ESOPHAGEAL DILATION WITH THE SAVORY DILATORS;  Surgeon: Rozetta Nunnery, MD;  Location: [facility];  Service: ENT;  Laterality: N/A;  . LAPAROSCOPIC APPENDECTOMY   07/05/2012   Procedure: APPENDECTOMY LAPAROSCOPIC;  Surgeon: Joyice Faster. Cornett, MD;  [facility];  Service: General;  Laterality: N/A;  . NASAL SINUS SURGERY    . PEG TUBE PLACEMENT  08/09/11 at [facility]   peg out now-2014  . PEG TUBE REMOVAL     Social History   Occupational History  . Not on file  Tobacco Use  . Smoking status: Never Smoker  . Smokeless tobacco: Never Used  Substance and Sexual Activity  . Alcohol use: Yes    Comment: 1 - 2 ALCOHOLIC BEVERAGES WEEKLY  . Drug use: Yes  Comment: COCAINE USE OVER 10 YRS AGO  . Sexual activity: Not on file

## 2018-06-09 NOTE — Telephone Encounter (Signed)
Patient would like to skip MRI, and go ahead with surgery. Please call patient with any questions.

## 2018-06-12 ENCOUNTER — Telehealth (INDEPENDENT_AMBULATORY_CARE_PROVIDER_SITE_OTHER): Payer: Self-pay | Admitting: Orthopaedic Surgery

## 2018-06-12 NOTE — Telephone Encounter (Signed)
PLEASE ADVISE.

## 2018-06-12 NOTE — Telephone Encounter (Signed)
Fill out surgery sheet and submit to [facility]

## 2018-06-12 NOTE — Telephone Encounter (Signed)
Patient is scheduled for left knee arthroscopy, media meniscectomy on June 29, 2018 @ [facility].  Patient has asked what type of anesthesia has been ordered.  Surgery sheet indicates "CHOICE" and "KNEE BLOCK".  Because patient has had tonsil and neck cancer, with 30 round of chemo, and  his throat has been stretched before  he has concerns about the tube (size in proportion to the narrow opening).  Please call and discuss options.  He is aware that [facility] will be calling him but would like to address some of these questions prior to that call.  Patient's cell  [phone]

## 2018-06-12 NOTE — Telephone Encounter (Signed)
Filled out surgery sheet and faxed to [facility]

## 2018-06-13 NOTE — Telephone Encounter (Signed)
Called.

## 2018-06-29 DIAGNOSIS — M23222 Derangement of posterior horn of medial meniscus due to old tear or injury, left knee: Secondary | ICD-10-CM | POA: Diagnosis not present

## 2018-06-29 DIAGNOSIS — M23232 Derangement of other medial meniscus due to old tear or injury, left knee: Secondary | ICD-10-CM | POA: Diagnosis not present

## 2018-06-29 DIAGNOSIS — M94262 Chondromalacia, left knee: Secondary | ICD-10-CM | POA: Diagnosis not present

## 2018-06-29 DIAGNOSIS — M6752 Plica syndrome, left knee: Secondary | ICD-10-CM | POA: Diagnosis not present

## 2018-06-29 DIAGNOSIS — G8918 Other acute postprocedural pain: Secondary | ICD-10-CM | POA: Diagnosis not present

## 2018-07-07 ENCOUNTER — Inpatient Hospital Stay (INDEPENDENT_AMBULATORY_CARE_PROVIDER_SITE_OTHER): Payer: 59 | Admitting: Orthopaedic Surgery

## 2018-07-10 ENCOUNTER — Ambulatory Visit (INDEPENDENT_AMBULATORY_CARE_PROVIDER_SITE_OTHER): Payer: 59 | Admitting: Orthopaedic Surgery

## 2018-07-10 ENCOUNTER — Encounter (INDEPENDENT_AMBULATORY_CARE_PROVIDER_SITE_OTHER): Payer: Self-pay | Admitting: Orthopaedic Surgery

## 2018-07-10 VITALS — BP 145/93 | HR 74 | Ht 69.0 in | Wt 169.0 lb

## 2018-07-10 DIAGNOSIS — G8929 Other chronic pain: Secondary | ICD-10-CM

## 2018-07-10 DIAGNOSIS — M25562 Pain in left knee: Secondary | ICD-10-CM

## 2018-07-10 NOTE — Progress Notes (Signed)
Office Visit Note   Patient: Jason Luna           Date of Birth: 08/18/1966           MRN: [id number] Visit Date: 07/10/2018              Requested by: Dorena Cookey, MD [place], [place] [address] PCP: Dorena Cookey, MD   Assessment & Plan: Visit Diagnoses:  1. Chronic pain of left knee     Plan: 11 days status post left knee arthroscopy.  Large complex tear of the medial meniscus with a flipped segment.  Doing quite well.  Back to work.  Not taking any pain medicine.  Occasional ache or pain and swelling.  Encourage exercises and plan to return to see me in a month doing very well  Follow-Up Instructions: Return in about 1 month (around 08/09/2018).   Orders:  No orders of the defined types were placed in this encounter.  No orders of the defined types were placed in this encounter.     Procedures: No procedures performed   Clinical Data: No additional findings.   Subjective: Chief Complaint  Patient presents with  . Follow-up    06/29/18 L KNEE ARTHROSCOPY STILL HAS SWELLING/PAIN BUT GETTING BETTER  Doing well 11 days postop.  No fever or chills.  No history of shortness of breath or chest pain.  No calf discomfort.  No distal edema.  HPI  Review of Systems  Constitutional: Negative for fatigue and fever.  HENT: Negative for ear pain.   Eyes: Negative for pain.  Respiratory: Negative for cough and shortness of breath.   Cardiovascular: Positive for leg swelling.  Gastrointestinal: Negative for constipation and diarrhea.  Genitourinary: Negative for difficulty urinating.  Musculoskeletal: Negative for back pain and neck pain.  Skin: Negative for rash.  Allergic/Immunologic: Negative for food allergies.  Neurological: Positive for weakness. Negative for numbness.  Hematological: Does not bruise/bleed easily.  Psychiatric/Behavioral: Negative for sleep disturbance.     Objective: Vital Signs: BP (!) 145/93 (BP Location: Left  Arm, Patient Position: Sitting, Cuff Size: Normal)   Pulse 74   Ht 5\' 9"  (1.753 m)   Wt 169 lb (76.7 kg)   BMI 24.96 kg/m   Physical Exam  Ortho Exam left knee arthroscopic portals are healing without problem.  No calf pain.  Full knee extension.  Minimal effusion.  No instability.  Still little bit of tenderness on the medial compartment as expected.  Flexed over 100 degrees.  Specialty Comments:  No specialty comments available.  Imaging: No results found.   PMFS History: Patient Active Problem List   Diagnosis Date Noted  . Insomnia 09/05/2014  . Dysphagia 05/09/2014  . Fatigue 05/08/2014  . 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 Allergy   . Migraines   . GERD (gastroesophageal reflux disease)   . History of throat cancer 07/05/2012  . History of radiation therapy   . Oropharynx cancer ([place])   . IRRITABLE BOWEL SYNDROME 11/08/2008  . ALLERGIC RHINITIS 06/23/2007   Past Medical History:  Diagnosis Date  . Acute appendicitis 07/05/2012  . Allergy   . Anxiety and depression   . Fatigue 05/08/2014  . Gastritis   . GERD (gastroesophageal reflux disease)   . Headache(784.0)   . History of radiation therapy 07/01/11 -08/19/11   right tonsil- 7000 cGy 35 sessions  . History of throat cancer 07/05/2012  He's had radiation and chemotherapies   . Hx of migraines 07/05/2012  . Migraines   . 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 Nasal septal perforation    hx of chronic  . 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 Weight loss, abnormal 2012   PEG tube placement Oct 2012    Family History  Problem Relation Age of Onset  . GI problems Mother        crohns  . Leukemia Mother        ALL age 17  . Cancer Father        pancreatic  . Asthma Other   . Cancer Other        prostate,skin    Past Surgical History:  Procedure Laterality Date  . ESOPHAGEAL DILATION  09/11/2012   Procedure:  ESOPHAGEAL DILATION;  Surgeon: Rozetta Nunnery, MD;  Location: [facility];  Service: ENT;  Laterality: Bilateral;  ESOPHAGOSCOPY WITH SAVORY DILATORS   . ESOPHAGEAL DILATION N/A 04/17/2013   Procedure: ESOPHAGEAL DILATION;  Surgeon: Rozetta Nunnery, MD;  Location: [facility];  Service: ENT;  Laterality: N/A;  . ESOPHAGEAL DILATION N/A 01/22/2014   Procedure: ESOPHAGEAL DILATION WITH THE SAVORY DILATORS;  Surgeon: Rozetta Nunnery, MD;  Location: [facility];  Service: ENT;  Laterality: N/A;  . LAPAROSCOPIC APPENDECTOMY  07/05/2012   Procedure: APPENDECTOMY LAPAROSCOPIC;  Surgeon: Joyice Faster. Cornett, MD;  [facility];  Service: General;  Laterality: N/A;  . NASAL SINUS SURGERY    . PEG TUBE PLACEMENT  08/09/11 at [facility]   peg out now-2014  . PEG TUBE REMOVAL     Social History   Occupational History  . Not on file  Tobacco Use  . Smoking status: Never Smoker  . Smokeless tobacco: Never Used  Substance and Sexual Activity  . Alcohol use: Yes    Comment: 1 - 2 ALCOHOLIC BEVERAGES WEEKLY  . Drug use: Yes    Comment: COCAINE USE OVER 10 YRS AGO  . Sexual activity: Not on file

## 2018-07-25 DIAGNOSIS — E291 Testicular hypofunction: Secondary | ICD-10-CM | POA: Diagnosis not present

## 2018-08-07 ENCOUNTER — Encounter (INDEPENDENT_AMBULATORY_CARE_PROVIDER_SITE_OTHER): Payer: Self-pay | Admitting: Orthopaedic Surgery

## 2018-08-07 ENCOUNTER — Ambulatory Visit (INDEPENDENT_AMBULATORY_CARE_PROVIDER_SITE_OTHER): Payer: 59 | Admitting: Radiology

## 2018-08-07 NOTE — Progress Notes (Unsigned)
Office Visit Note   Patient: Jason Luna           Date of Birth: 09/09/1966           MRN: [id number] Visit Date: 08/07/2018              Requested by: Dorena Cookey, MD [place], [place] [address] PCP: Dorena Cookey, MD   Assessment & Plan: Visit Diagnoses: No diagnosis found.  Plan: ***  Follow-Up Instructions: No follow-ups on file.   Orders:  No orders of the defined types were placed in this encounter.  No orders of the defined types were placed in this encounter.     Procedures: No procedures performed   Clinical Data: No additional findings.   Subjective: No chief complaint on file.   HPI  Review of Systems  Constitutional: Negative for fatigue and fever.  HENT: Negative for ear pain.   Eyes: Negative for pain.  Respiratory: Negative for cough and shortness of breath.   Cardiovascular: Positive for leg swelling.  Gastrointestinal: Negative for constipation and diarrhea.  Genitourinary: Negative for difficulty urinating.  Musculoskeletal: Negative for back pain and neck pain.  Skin: Negative for rash.  Allergic/Immunologic: Negative for food allergies.  Neurological: Positive for weakness. Negative for numbness.  Psychiatric/Behavioral: Negative for sleep disturbance.     Objective: Vital Signs: There were no vitals taken for this visit.  Physical Exam  Ortho Exam  Specialty Comments:  No specialty comments available.  Imaging: No results found.   PMFS History: Patient Active Problem List   Diagnosis Date Noted  . Insomnia 09/05/2014  . Dysphagia 05/09/2014  . Fatigue 05/08/2014  . 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 History of drug abuse in remission ([place]) 04/12/2013  . Allergy   . Migraines   . GERD (gastroesophageal reflux disease)   . History of throat cancer 07/05/2012  . History of radiation therapy   . Oropharynx cancer ([place])   . IRRITABLE BOWEL SYNDROME 11/08/2008  . ALLERGIC RHINITIS  06/23/2007   Past Medical History:  Diagnosis Date  . Acute appendicitis 07/05/2012  . Allergy   . Anxiety and depression   . Fatigue 05/08/2014  . Gastritis   . GERD (gastroesophageal reflux disease)   . Headache(784.0)   . History of radiation therapy 07/01/11 -08/19/11   right tonsil- 7000 cGy 35 sessions  . History of throat cancer 07/05/2012   He's had radiation and chemotherapies   . Hx of migraines 07/05/2012  . Migraines   . 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 Nasal septal perforation    hx of chronic  . 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 Weight loss, abnormal 2012   PEG tube placement Oct 2012    Family History  Problem Relation Age of Onset  . GI problems Mother        crohns  . Leukemia Mother        ALL age 46  . Cancer Father        pancreatic  . Asthma Other   . Cancer Other        prostate,skin    Past Surgical History:  Procedure Laterality Date  . ESOPHAGEAL DILATION  09/11/2012   Procedure: ESOPHAGEAL DILATION;  Surgeon: Rozetta Nunnery, MD;  Location: [place];  Service: ENT;  Laterality: Bilateral;  ESOPHAGOSCOPY WITH SAVORY DILATORS   . ESOPHAGEAL DILATION N/A 04/17/2013   Procedure: ESOPHAGEAL DILATION;  Surgeon: Rozetta Nunnery, MD;  Location: [facility];  Service: ENT;  Laterality: N/A;  . ESOPHAGEAL DILATION N/A 01/22/2014   Procedure: ESOPHAGEAL DILATION WITH THE SAVORY DILATORS;  Surgeon: Rozetta Nunnery, MD;  Location: [facility];  Service: ENT;  Laterality: N/A;  . LAPAROSCOPIC APPENDECTOMY  07/05/2012   Procedure: APPENDECTOMY LAPAROSCOPIC;  Surgeon: Joyice Faster. Cornett, MD;  [facility];  Service: General;  Laterality: N/A;  . NASAL SINUS SURGERY    . PEG TUBE PLACEMENT  08/09/11 at [facility] IR   peg out now-2014  . PEG TUBE REMOVAL     Social History   Occupational History  . Not on file  Tobacco Use  . Smoking status: Never Smoker    . Smokeless tobacco: Never Used  Substance and Sexual Activity  . Alcohol use: Yes    Comment: 1 - 2 ALCOHOLIC BEVERAGES WEEKLY  . Drug use: Yes    Comment: COCAINE USE OVER 10 YRS AGO  . Sexual activity: Not on file

## 2018-09-27 NOTE — Progress Notes (Signed)
Cannot sign

## 2020-01-01 ENCOUNTER — Other Ambulatory Visit: Payer: Self-pay

## 2020-01-01 ENCOUNTER — Ambulatory Visit (INDEPENDENT_AMBULATORY_CARE_PROVIDER_SITE_OTHER): Payer: 59 | Admitting: Otolaryngology

## 2020-01-01 DIAGNOSIS — Z85819 Personal history of malignant neoplasm of unspecified site of lip, oral cavity, and pharynx: Secondary | ICD-10-CM | POA: Diagnosis not present

## 2020-01-01 NOTE — Progress Notes (Signed)
HPI: Jason Luna is a 54 y.o. male who I made a telephone visit with him in the office today.  Patient was hesitant about coming into the office because of history of cancer and Covid precautions.  He was last seen 2 years ago. 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  This was treated with chemoradiation.  Following this treatment he has had several episodes of upper cervical esophageal stenosis requiring dilation 3-4 times.  He is also had chronic dry throat and uses cevimeline 30 mg 3 times daily on a regular basis.  He needed a refill of this medication. He has been doing well otherwise with no real difficulties eating or swallowing.  His weight has been relatively stable at 164 pounds. He uses a humidifier at night and does better using xlear for nasal congestion and dryness. He actually swallows large tablets easier than smaller tablets.  Past Medical History:  Diagnosis Date  . Acute appendicitis 07/05/2012  . Allergy   . Anxiety and depression   . Fatigue 05/08/2014  . Gastritis   . GERD (gastroesophageal reflux disease)   . Headache(784.0)   . History of radiation therapy 07/01/11 -08/19/11   right tonsil- 7000 cGy 35 sessions  . History of throat cancer 07/05/2012   He's had radiation and chemotherapies   . Hx of migraines 07/05/2012  . Migraines   . 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 Nasal septal perforation    hx of chronic  . 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 Weight loss, abnormal 2012   PEG tube placement Oct 2012   Past Surgical History:  Procedure Laterality Date  . ESOPHAGEAL DILATION  09/11/2012   Procedure: ESOPHAGEAL DILATION;  Surgeon: Rozetta Nunnery, MD;  Location: [place];  Service: ENT;  Laterality: Bilateral;  ESOPHAGOSCOPY WITH SAVORY DILATORS   . ESOPHAGEAL DILATION N/A 04/17/2013   Procedure: ESOPHAGEAL DILATION;  Surgeon: Rozetta Nunnery, MD;  Location: [place];  Service: ENT;  Laterality: N/A;  . ESOPHAGEAL DILATION N/A 01/22/2014   Procedure: ESOPHAGEAL DILATION WITH THE SAVORY DILATORS;  Surgeon: Rozetta Nunnery, MD;  Location: [place];  Service: ENT;  Laterality: N/A;  . LAPAROSCOPIC APPENDECTOMY  07/05/2012   Procedure: APPENDECTOMY LAPAROSCOPIC;  Surgeon: Joyice Faster. Cornett, MD;  [facility];  Service: General;  Laterality: N/A;  . NASAL SINUS SURGERY    . PEG TUBE PLACEMENT  08/09/11 at [facility] IR   peg out now-2014  . PEG TUBE REMOVAL     Social History   Socioeconomic History  . Marital status: Divorced    Spouse name: Not on file  . Number of children: Not on file  . Years of education: Not on file  . Highest education level: Not on file  Occupational History  . Not on file  Tobacco Use  . Smoking status: Never Smoker  . Smokeless tobacco: Never Used  Substance and Sexual Activity  . Alcohol use: Yes    Comment: 1 - 2 ALCOHOLIC BEVERAGES WEEKLY  . Drug use: Yes    Comment: COCAINE USE OVER 10 YRS AGO  . Sexual activity: Not on file  Other Topics Concern  . Not on file  Social History Narrative  . Not on file   Social Determinants of Health   Financial Resource Strain:   . Difficulty of Paying Living Expenses: Not on file  Food  Insecurity:   . Worried About Charity fundraiser in the Last Year: Not on file  . Ran Out of Food in the Last Year: Not on file  Transportation Needs:   . Lack of Transportation (Medical): Not on file  . Lack of Transportation (Non-Medical): Not on file  Physical Activity:   . Days of Exercise per Week: Not on file  . Minutes of Exercise per Session: Not on file  Stress:   . Feeling of Stress : Not on file  Social Connections:   . Frequency of Communication with Friends and Family: Not on file  . Frequency of Social Gatherings with Friends and Family: Not on file  . Attends Religious Services: Not on file  . Active  Member of Clubs or Organizations: Not on file  . Attends Archivist Meetings: Not on file  . Marital Status: Not on file   Family History  Problem Relation Age of Onset  . GI problems Mother        crohns  . Leukemia Mother        ALL age 18  . Cancer Father        pancreatic  . Asthma Other   . Cancer Other        prostate,skin   Allergies  Allergen Reactions  . Naproxen Sodium Anaphylaxis  . Other Other (See Comments)    headache  . Bee Venom Swelling  . Zofran [Ondansetron Hcl]    Prior to Admission medications   Medication Sig Start Date End Date Taking? Authorizing Provider  amitriptyline (ELAVIL) 25 MG tablet Take 1 tablet (25 mg total) by mouth at bedtime. Patient not taking: Reported on 05/03/2018 07/23/16   Dorothyann Peng, NP  butalbital-acetaminophen-caffeine (FIORICET/CODEINE) [phone] MG capsule Take 1 capsule by mouth every 6 (six) hours as needed for headache. 05/03/18   Panosh, Standley Brooking, MD  cevimeline (EVOXAC) 30 MG capsule Take 30 mg by mouth 3 (three) times daily.    [provider]  clotrimazole-betamethasone (LOTRISONE) cream Apply 1 application topically 2 (two) times daily. Patient not taking: Reported on 05/03/2018 11/07/17   Dorena Cookey, MD  divalproex (DEPAKOTE) 250 MG DR tablet take 1 tablet by mouth once daily Patient not taking: Reported on 05/03/2018 10/21/15   Dorena Cookey, MD  EPINEPHrine (EPIPEN) 0.3 mg/0.3 mL SOAJ injection Inject 0.3 mLs (0.3 mg total) into the muscle once. Patient not taking: Reported on 05/03/2018 11/13/13   Dorena Cookey, MD  LORazepam (ATIVAN) 1 MG tablet take 1 tablet by mouth if needed for anxiety Patient not taking: Reported on 05/03/2018    Dorena Cookey, MD  omeprazole (PRILOSEC) 20 MG capsule Take 1 capsule (20 mg total) by mouth 2 (two) times daily. 09/11/12   Rozetta Nunnery, MD  promethazine (PHENERGAN) 12.5 MG suppository Place 1 suppository (12.5 mg total) rectally every 6 (six) hours as  needed for nausea or vomiting. 11/07/17   Dorena Cookey, MD  promethazine (PHENERGAN) 25 MG suppository Place 1 suppository (25 mg total) rectally every 6 (six) hours as needed for up to 7 days for nausea or vomiting. Migraines 05/03/18 05/10/18  Panosh, Standley Brooking, MD  promethazine (PHENERGAN) 25 MG tablet Take 1 tablet (25 mg total) by mouth every 8 (eight) hours as needed for nausea or vomiting. 11/07/17   Dorena Cookey, MD  rizatriptan (MAXALT-MLT) 10 MG disintegrating tablet Take 1 tablet (10 mg total) by mouth as needed for migraine. May repeat in  2 hours if needed 11/07/17   Dorena Cookey, MD  triamcinolone (KENALOG) 0.025 % cream  02/27/15   [provider]     Positive ROS: Otherwise negative  All other systems have been reviewed and were otherwise negative with the exception of those mentioned in the HPI and as above.  Physical Exam: Constitutional: On phone conversation patient is alert and oriented.  Does not express any concerns. He is having no difficulty swallowing but does complain of chronic dry throat. He is having no swelling or nodules in his neck to palpation. He is having no respiratory problems  Procedures  Assessment: 8 years status post chemoradiation treatment of a primary right tonsil squamous cell carcinoma.  Plan: He is requesting refills of his cevimeline to [facility] 30 mg 3 times daily with 60-month supply and 3 refills. This was performed in the office today.   Radene Journey, MD

## 2020-01-02 ENCOUNTER — Other Ambulatory Visit (INDEPENDENT_AMBULATORY_CARE_PROVIDER_SITE_OTHER): Payer: Self-pay

## 2020-01-02 MED ORDER — CEVIMELINE HCL 30 MG PO CAPS: 30.0000 mg | ORAL_CAPSULE | Freq: Three times a day (TID) | ORAL | 3 refills | Status: AC

## 2021-05-21 ENCOUNTER — Encounter: Payer: Self-pay | Admitting: Emergency Medicine

## 2021-05-21 ENCOUNTER — Emergency Department
Admission: EM | Admit: 2021-05-21 | Discharge: 2021-05-21 | Disposition: A | Discharge status: Home / Self Care | Payer: Self-pay | Source: Home / Self Care

## 2021-05-21 ENCOUNTER — Emergency Department: Admit: 2021-05-21 | Discharge status: Absent without leave | Payer: Self-pay

## 2021-05-21 ENCOUNTER — Other Ambulatory Visit: Payer: Self-pay

## 2021-05-21 ENCOUNTER — Emergency Department (INDEPENDENT_AMBULATORY_CARE_PROVIDER_SITE_OTHER): Payer: 59

## 2021-05-21 DIAGNOSIS — M79644 Pain in right finger(s): Secondary | ICD-10-CM | POA: Diagnosis not present

## 2021-05-21 DIAGNOSIS — S6991XA Unspecified injury of right wrist, hand and finger(s), initial encounter: Secondary | ICD-10-CM

## 2021-05-21 NOTE — ED Triage Notes (Signed)
RT thumb injury fell on it x 2 days ago.

## 2021-05-21 NOTE — ED Provider Notes (Signed)
[facility]    CSN: [id number] Arrival date & time: 05/21/21  1212      History   Chief Complaint Chief Complaint  Patient presents with   Finger Injury    HPI Jason Luna is a 55 y.o. male.   HPI 55 year old male presents with right thumb injury reports falling on the right thumb 2 days ago when inadvertently tripping over a curb.  Patient presents today with right thumb spica on right hand/right thumb.  Past Medical History:  Diagnosis Date   Acute appendicitis 07/05/2012   Allergy    Anxiety and depression    Fatigue 05/08/2014   Gastritis    GERD (gastroesophageal reflux disease)    Headache(784.0)    History of radiation therapy 07/01/11 -08/19/11   right tonsil- 7000 cGy 35 sessions   History of throat cancer 07/05/2012   He's had radiation and chemotherapies    Hx of migraines 07/05/2012   Migraines    Mucositis (ulcerative) due to antineoplastic therapy    Nasal septal perforation    hx of chronic   Oropharynx cancer ([facility]) 2012   HPV positive-tx chemo-radiation   Weight loss, abnormal 2012   PEG tube placement Oct 2012    Patient Active Problem List   Diagnosis Date Noted   Insomnia 09/05/2014   Dysphagia 05/09/2014   Fatigue 05/08/2014   Insomnia, idiopathic 04/12/2013   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) 04/12/2013   Allergy    Migraines    GERD (gastroesophageal reflux disease)    History of throat cancer 07/05/2012   History of radiation therapy    Oropharynx cancer (Jason Luna)    IRRITABLE BOWEL SYNDROME 11/08/2008   ALLERGIC RHINITIS 06/23/2007    Past Surgical History:  Procedure Laterality Date   ESOPHAGEAL DILATION  09/11/2012   Procedure: ESOPHAGEAL DILATION;  Surgeon: Rozetta Nunnery, MD;  Location: [place];  Service: ENT;  Laterality: Bilateral;  ESOPHAGOSCOPY WITH SAVORY DILATORS    ESOPHAGEAL DILATION N/A 04/17/2013   Procedure: ESOPHAGEAL DILATION;  Surgeon: Rozetta Nunnery, MD;  Location: [place];  Service: ENT;  Laterality: N/A;   ESOPHAGEAL DILATION N/A 01/22/2014   Procedure: ESOPHAGEAL DILATION WITH THE SAVORY DILATORS;  Surgeon: Rozetta Nunnery, MD;  Location: [place];  Service: ENT;  Laterality: N/A;   LAPAROSCOPIC APPENDECTOMY  07/05/2012   Procedure: APPENDECTOMY LAPAROSCOPIC;  Surgeon: Joyice Faster. Cornett, MD;  [facility];  Service: General;  Laterality: N/A;   NASAL SINUS SURGERY     PEG TUBE PLACEMENT  08/09/11 at [facility] IR   peg out now-2014   PEG TUBE REMOVAL         Home Medications    Prior to Admission medications   Medication Sig Start Date End Date Taking? Authorizing Provider  butalbital-acetaminophen-caffeine (FIORICET/CODEINE) 50-325-40-30 MG capsule Take 1 capsule by mouth every 6 (six) hours as needed for headache. 05/03/18   Panosh, Standley Brooking, MD  cevimeline (EVOXAC) 30 MG capsule Take 1 capsule (30 mg total) by mouth 3 (three) times daily. 01/02/20   Rozetta Nunnery, MD  clotrimazole-betamethasone (LOTRISONE) cream Apply 1 application topically 2 (two) times daily. Patient not taking: Reported on 05/03/2018 11/07/17   Dorena Cookey, MD  divalproex (DEPAKOTE) 250 MG DR tablet take 1 tablet by mouth once daily Patient not taking: Reported on 05/03/2018 10/21/15   Dorena Cookey, MD  EPINEPHrine (EPIPEN) 0.3 mg/0.3 mL SOAJ injection Inject 0.3 mLs (0.3  mg total) into the muscle once. Patient not taking: Reported on 05/03/2018 11/13/13   Dorena Cookey, MD  LORazepam (ATIVAN) 1 MG tablet take 1 tablet by mouth if needed for anxiety Patient not taking: Reported on 05/03/2018    Dorena Cookey, MD  omeprazole (PRILOSEC) 20 MG capsule Take 1 capsule (20 mg total) by mouth 2 (two) times daily. 09/11/12   Rozetta Nunnery, MD  promethazine (PHENERGAN) 25 MG suppository Place 1 suppository (25 mg total) rectally every 6 (six) hours as needed for up to 7 days for nausea or vomiting. Migraines 05/03/18 05/10/18  Panosh, Standley Brooking, MD   promethazine (PHENERGAN) 25 MG tablet Take 1 tablet (25 mg total) by mouth every 8 (eight) hours as needed for nausea or vomiting. 11/07/17   Dorena Cookey, MD  rizatriptan (MAXALT-MLT) 10 MG disintegrating tablet Take 1 tablet (10 mg total) by mouth as needed for migraine. May repeat in 2 hours if needed 11/07/17   Dorena Cookey, MD    Family History Family History  Problem Relation Age of Onset   GI problems Mother        crohns   Leukemia Mother        ALL age 79   Cancer Father        pancreatic   Asthma Other    Cancer Other        prostate,skin    Social History Social History   Tobacco Use   Smoking status: Never   Smokeless tobacco: Never  Substance Use Topics   Alcohol use: Yes    Comment: 1 - 2 ALCOHOLIC BEVERAGES WEEKLY   Drug use: Yes    Comment: COCAINE USE OVER 10 YRS AGO     Allergies   Naproxen sodium, Other, Bee venom, and Zofran [ondansetron hcl]   Review of Systems Review of Systems  Musculoskeletal:        Right thumb injury x2 days    Physical Exam Triage Vital Signs ED Triage Vitals  Enc Vitals Group     BP 05/21/21 1238 (!) 160/111     Pulse Rate 05/21/21 1238 79     Resp 05/21/21 1238 18     Temp 05/21/21 1238 98.8 F (37.1 C)     Temp Source 05/21/21 1238 Oral     SpO2 05/21/21 1238 97 %     Weight 05/21/21 1241 170 lb (77.1 kg)     Height 05/21/21 1241 5\' 9"  (1.753 m)     Head Circumference --      Peak Flow --      Pain Score 05/21/21 1239 8     Pain Loc --      Pain Edu? --      Excl. in [place]? --    No data found.  Updated Vital Signs BP (!) 174/112 (BP Location: Right Arm)   Pulse 79   Temp 98.8 F (37.1 C) (Oral)   Resp 18   Ht 5\' 9"  (1.753 m)   Wt 170 lb (77.1 kg)   SpO2 97%   BMI 25.10 kg/m   Physical Exam Vitals and nursing note reviewed.  Constitutional:      General: He is not in acute distress.    Appearance: Normal appearance. He is normal weight. He is not ill-appearing.  HENT:     Head:  Normocephalic and atraumatic.     Mouth/Throat:     Mouth: Mucous membranes are moist.     Pharynx: Oropharynx  is clear.  Eyes:     Extraocular Movements: Extraocular movements intact.     Conjunctiva/sclera: Conjunctivae normal.     Pupils: Pupils are equal, round, and reactive to light.  Cardiovascular:     Rate and Rhythm: Normal rate and regular rhythm.     Pulses: Normal pulses.     Heart sounds: Normal heart sounds.  Pulmonary:     Effort: Pulmonary effort is normal. No respiratory distress.     Breath sounds: Normal breath sounds. No wheezing, rhonchi or rales.  Musculoskeletal:     Cervical back: Normal range of motion and neck supple. No tenderness.     Comments: Right hand/right thumb: Limited range of motion with flexion extension TTP over trapezium, trapezoid, and first metacarpal  Lymphadenopathy:     Cervical: No cervical adenopathy.  Skin:    General: Skin is warm and dry.  Neurological:     General: No focal deficit present.     Mental Status: He is alert and oriented to person, place, and time.  Psychiatric:        Mood and Affect: Mood normal.        Behavior: Behavior normal.     UC Treatments / Results  Labs (all labs ordered are listed, but only abnormal results are displayed) Labs Reviewed - No data to display  EKG   Radiology DG Finger Thumb Right  Result Date: 05/21/2021 CLINICAL DATA:  Fall 2 days ago. EXAM: RIGHT THUMB 2+V COMPARISON:  None. FINDINGS: Right thumb is intact without a fracture or dislocation. Normal alignment. No significant arthropathy. No focal soft tissue abnormality. IMPRESSION: No acute abnormality to the right thumb. Electronically Signed   By: Markus Daft M.D.   On: 05/21/2021 13:21    Procedures Procedures (including critical care time)  Medications Ordered in UC Medications - No data to display  Initial Impression / Assessment and Plan / UC Course  I have reviewed the triage vital signs and the nursing  notes.  Pertinent labs & imaging results that were available during my care of the patient were reviewed by me and considered in my medical decision making (see chart for details).    1 1.  Right thumb injury initial encounter-x-ray negative for fracture/abnormality, advised/encouraged patient to continue to wear thumb spica 24/7 (except when bathing) for the next 10 to 14 days, 2.  Right thumb pain advised patient may take OTC ibuprofen 600 to 800 mg 2-3 times daily, as needed for the next 5 to 7 days.  Patient discharged home, hemodynamically stable. Final Clinical Impressions(s) / UC Diagnoses   Final diagnoses:  Pain of right thumb  Injury of right thumb, initial encounter     Discharge Instructions      Advised patient may take OTC Ibuprofen 600 to 800 mg 2-3 times daily, as needed for the next 5 to 7 days for right thumb pain.  Encourage patient to wear currently placed thumb spica 24/7 for the next 14 days except when bathing.  Advised patient to avoid moderate to strenuous activities, lifting, or repetitive motion activities involving affected area of right thumb for the next 14 days.     ED Prescriptions   None    PDMP not reviewed this encounter.   Eliezer Lofts, [place] 05/21/21 1349

## 2021-05-21 NOTE — Discharge Instructions (Addendum)
Advised patient may take OTC Ibuprofen 600 to 800 mg 2-3 times daily, as needed for the next 5 to 7 days for right thumb pain.  Encourage patient to wear currently placed thumb spica 24/7 for the next 14 days except when bathing.  Advised patient to avoid moderate to strenuous activities, lifting, or repetitive motion activities involving affected area of right thumb for the next 14 days.

## 2021-12-08 DIAGNOSIS — M9902 Segmental and somatic dysfunction of thoracic region: Secondary | ICD-10-CM | POA: Diagnosis not present

## 2021-12-08 DIAGNOSIS — M5013 Cervical disc disorder with radiculopathy, cervicothoracic region: Secondary | ICD-10-CM | POA: Diagnosis not present

## 2021-12-08 DIAGNOSIS — M9901 Segmental and somatic dysfunction of cervical region: Secondary | ICD-10-CM | POA: Diagnosis not present

## 2021-12-08 DIAGNOSIS — G44209 Tension-type headache, unspecified, not intractable: Secondary | ICD-10-CM | POA: Diagnosis not present

## 2021-12-15 DIAGNOSIS — G44209 Tension-type headache, unspecified, not intractable: Secondary | ICD-10-CM | POA: Diagnosis not present

## 2021-12-15 DIAGNOSIS — M9901 Segmental and somatic dysfunction of cervical region: Secondary | ICD-10-CM | POA: Diagnosis not present

## 2021-12-15 DIAGNOSIS — M5013 Cervical disc disorder with radiculopathy, cervicothoracic region: Secondary | ICD-10-CM | POA: Diagnosis not present

## 2021-12-15 DIAGNOSIS — M9902 Segmental and somatic dysfunction of thoracic region: Secondary | ICD-10-CM | POA: Diagnosis not present

## 2021-12-22 DIAGNOSIS — M9901 Segmental and somatic dysfunction of cervical region: Secondary | ICD-10-CM | POA: Diagnosis not present

## 2021-12-22 DIAGNOSIS — M9902 Segmental and somatic dysfunction of thoracic region: Secondary | ICD-10-CM | POA: Diagnosis not present

## 2021-12-22 DIAGNOSIS — M5013 Cervical disc disorder with radiculopathy, cervicothoracic region: Secondary | ICD-10-CM | POA: Diagnosis not present

## 2021-12-22 DIAGNOSIS — G44209 Tension-type headache, unspecified, not intractable: Secondary | ICD-10-CM | POA: Diagnosis not present

## 2021-12-29 DIAGNOSIS — G44209 Tension-type headache, unspecified, not intractable: Secondary | ICD-10-CM | POA: Diagnosis not present

## 2021-12-29 DIAGNOSIS — M9902 Segmental and somatic dysfunction of thoracic region: Secondary | ICD-10-CM | POA: Diagnosis not present

## 2021-12-29 DIAGNOSIS — M9901 Segmental and somatic dysfunction of cervical region: Secondary | ICD-10-CM | POA: Diagnosis not present

## 2021-12-29 DIAGNOSIS — M5013 Cervical disc disorder with radiculopathy, cervicothoracic region: Secondary | ICD-10-CM | POA: Diagnosis not present

## 2022-06-14 DIAGNOSIS — E539 Vitamin B deficiency, unspecified: Secondary | ICD-10-CM | POA: Diagnosis not present

## 2022-06-14 DIAGNOSIS — E291 Testicular hypofunction: Secondary | ICD-10-CM | POA: Diagnosis not present

## 2022-06-14 DIAGNOSIS — E559 Vitamin D deficiency, unspecified: Secondary | ICD-10-CM | POA: Diagnosis not present

## 2022-06-14 DIAGNOSIS — E039 Hypothyroidism, unspecified: Secondary | ICD-10-CM | POA: Diagnosis not present

## 2022-06-14 DIAGNOSIS — Z79899 Other long term (current) drug therapy: Secondary | ICD-10-CM | POA: Diagnosis not present

## 2022-07-22 DIAGNOSIS — M9906 Segmental and somatic dysfunction of lower extremity: Secondary | ICD-10-CM | POA: Diagnosis not present

## 2022-07-22 DIAGNOSIS — M9901 Segmental and somatic dysfunction of cervical region: Secondary | ICD-10-CM | POA: Diagnosis not present

## 2022-07-22 DIAGNOSIS — M9902 Segmental and somatic dysfunction of thoracic region: Secondary | ICD-10-CM | POA: Diagnosis not present

## 2022-07-22 DIAGNOSIS — M7541 Impingement syndrome of right shoulder: Secondary | ICD-10-CM | POA: Diagnosis not present

## 2022-07-29 DIAGNOSIS — M9906 Segmental and somatic dysfunction of lower extremity: Secondary | ICD-10-CM | POA: Diagnosis not present

## 2022-07-29 DIAGNOSIS — M7541 Impingement syndrome of right shoulder: Secondary | ICD-10-CM | POA: Diagnosis not present

## 2022-07-29 DIAGNOSIS — M9902 Segmental and somatic dysfunction of thoracic region: Secondary | ICD-10-CM | POA: Diagnosis not present

## 2022-07-29 DIAGNOSIS — M9901 Segmental and somatic dysfunction of cervical region: Secondary | ICD-10-CM | POA: Diagnosis not present

## 2022-08-02 DIAGNOSIS — M7541 Impingement syndrome of right shoulder: Secondary | ICD-10-CM | POA: Diagnosis not present

## 2022-08-02 DIAGNOSIS — M9901 Segmental and somatic dysfunction of cervical region: Secondary | ICD-10-CM | POA: Diagnosis not present

## 2022-08-02 DIAGNOSIS — M9902 Segmental and somatic dysfunction of thoracic region: Secondary | ICD-10-CM | POA: Diagnosis not present

## 2022-08-02 DIAGNOSIS — M9906 Segmental and somatic dysfunction of lower extremity: Secondary | ICD-10-CM | POA: Diagnosis not present

## 2022-08-12 DIAGNOSIS — M9906 Segmental and somatic dysfunction of lower extremity: Secondary | ICD-10-CM | POA: Diagnosis not present

## 2022-08-12 DIAGNOSIS — M9901 Segmental and somatic dysfunction of cervical region: Secondary | ICD-10-CM | POA: Diagnosis not present

## 2022-08-12 DIAGNOSIS — M9902 Segmental and somatic dysfunction of thoracic region: Secondary | ICD-10-CM | POA: Diagnosis not present

## 2022-08-12 DIAGNOSIS — M7541 Impingement syndrome of right shoulder: Secondary | ICD-10-CM | POA: Diagnosis not present

## 2023-02-02 DIAGNOSIS — H53143 Visual discomfort, bilateral: Secondary | ICD-10-CM | POA: Diagnosis not present

## 2023-03-09 DIAGNOSIS — E039 Hypothyroidism, unspecified: Secondary | ICD-10-CM | POA: Diagnosis not present

## 2023-03-09 DIAGNOSIS — Z79899 Other long term (current) drug therapy: Secondary | ICD-10-CM | POA: Diagnosis not present

## 2023-03-09 DIAGNOSIS — Z125 Encounter for screening for malignant neoplasm of prostate: Secondary | ICD-10-CM | POA: Diagnosis not present

## 2023-03-16 DIAGNOSIS — C140 Malignant neoplasm of pharynx, unspecified: Secondary | ICD-10-CM | POA: Diagnosis not present

## 2023-03-16 DIAGNOSIS — K760 Fatty (change of) liver, not elsewhere classified: Secondary | ICD-10-CM | POA: Diagnosis not present

## 2023-03-16 DIAGNOSIS — K8020 Calculus of gallbladder without cholecystitis without obstruction: Secondary | ICD-10-CM | POA: Diagnosis not present

## 2023-04-13 DIAGNOSIS — H53143 Visual discomfort, bilateral: Secondary | ICD-10-CM | POA: Diagnosis not present

## 2023-09-27 DIAGNOSIS — E782 Mixed hyperlipidemia: Secondary | ICD-10-CM | POA: Diagnosis not present

## 2023-09-27 DIAGNOSIS — R5381 Other malaise: Secondary | ICD-10-CM | POA: Diagnosis not present

## 2023-09-27 DIAGNOSIS — Z125 Encounter for screening for malignant neoplasm of prostate: Secondary | ICD-10-CM | POA: Diagnosis not present

## 2023-09-27 DIAGNOSIS — R6882 Decreased libido: Secondary | ICD-10-CM | POA: Diagnosis not present

## 2023-09-27 DIAGNOSIS — Z79899 Other long term (current) drug therapy: Secondary | ICD-10-CM | POA: Diagnosis not present

## 2023-10-19 DIAGNOSIS — M9903 Segmental and somatic dysfunction of lumbar region: Secondary | ICD-10-CM | POA: Diagnosis not present

## 2023-10-19 DIAGNOSIS — M9907 Segmental and somatic dysfunction of upper extremity: Secondary | ICD-10-CM | POA: Diagnosis not present

## 2023-10-19 DIAGNOSIS — M7541 Impingement syndrome of right shoulder: Secondary | ICD-10-CM | POA: Diagnosis not present

## 2023-10-19 DIAGNOSIS — M9901 Segmental and somatic dysfunction of cervical region: Secondary | ICD-10-CM | POA: Diagnosis not present

## 2023-10-24 ENCOUNTER — Encounter: Payer: Self-pay | Admitting: Family Medicine

## 2023-10-24 ENCOUNTER — Ambulatory Visit
Admission: RE | Admit: 2023-10-24 | Discharge: 2023-10-24 | Disposition: A | Discharge status: Home / Self Care | Payer: BC Managed Care – PPO | Source: Ambulatory Visit | Attending: Family Medicine | Admitting: Family Medicine

## 2023-10-24 ENCOUNTER — Ambulatory Visit (INDEPENDENT_AMBULATORY_CARE_PROVIDER_SITE_OTHER): Payer: BC Managed Care – PPO | Admitting: Family Medicine

## 2023-10-24 VITALS — BP 151/88 | Ht 69.0 in | Wt 173.0 lb

## 2023-10-24 DIAGNOSIS — G8929 Other chronic pain: Secondary | ICD-10-CM | POA: Diagnosis not present

## 2023-10-24 DIAGNOSIS — M25562 Pain in left knee: Secondary | ICD-10-CM

## 2023-10-24 DIAGNOSIS — M1712 Unilateral primary osteoarthritis, left knee: Secondary | ICD-10-CM | POA: Diagnosis not present

## 2023-10-24 MED ORDER — METHYLPREDNISOLONE ACETATE 40 MG/ML IJ SUSP
40.0000 mg | Freq: Once | INTRAMUSCULAR | Status: AC
  Administered 2023-10-24: 40 mg via INTRA_ARTICULAR

## 2023-10-24 NOTE — Progress Notes (Signed)
DATE OF VISIT: 10/24/2023        [name] DOB: 1966-07-30 MRN: [id number]  CC:  Lt knee pain  History- Jason Luna is a 57 y.o.  male for evaluation and treatment of left knee pain Referred by Dr Alicia Amel, [place]  Lt knee pain for several years Bent over to pick up a broom at the time it flared in 2019 Prior knee surgery 2019 at [facility] with Dr. Cleophas Dunker -Knee arthroscopy for complex tear of the medial meniscus with flipped segment -Last seen by Dr. 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 (+)swelling No mechanical symptoms Feels unstable at times Tried brace  - was hinged knee brace felt like was restricting movement No ice No recent imaging Using Tylenol Arthritis prn No prior injections in the knee  PMH significant for throat CA - had radiation and chemotherapy - in 2012 -- continues regular monitoring   Past Medical History Past Medical History:  Diagnosis Date   Acute appendicitis 07/05/2012   Allergy    Anxiety and depression    Fatigue 05/08/2014   Gastritis    GERD (gastroesophageal reflux disease)    Headache(784.0)    History of radiation therapy 07/01/11 -08/19/11   right tonsil- 7000 cGy 35 sessions   History of throat cancer 07/05/2012   He's had radiation and chemotherapies    Hx of migraines 07/05/2012   Migraines    Mucositis (ulcerative) due to antineoplastic therapy    Nasal septal perforation    hx of chronic   Oropharynx cancer ([facility]) 2012   HPV positive-tx chemo-radiation   Weight loss, abnormal 2012   PEG tube placement Oct 2012    Past Surgical History Past Surgical History:  Procedure Laterality Date   ESOPHAGEAL DILATION  09/11/2012   Procedure: ESOPHAGEAL DILATION;  Surgeon: Drema Halon, MD;  Location: [facility];  Service: ENT;  Laterality: Bilateral;   ESOPHAGOSCOPY WITH SAVORY DILATORS    ESOPHAGEAL DILATION N/A 04/17/2013   Procedure: ESOPHAGEAL DILATION;  Surgeon: Drema Halon, MD;  Location: [facility];  Service: ENT;  Laterality: N/A;   ESOPHAGEAL DILATION N/A 01/22/2014   Procedure: ESOPHAGEAL DILATION WITH THE SAVORY DILATORS;  Surgeon: Drema Halon, MD;  Location: [facility];  Service: ENT;  Laterality: N/A;   LAPAROSCOPIC APPENDECTOMY  07/05/2012   Procedure: APPENDECTOMY LAPAROSCOPIC;  Surgeon: Clovis Pu. Cornett, MD;  [facility];  Service: General;  Laterality: N/A;   NASAL SINUS SURGERY     PEG TUBE PLACEMENT  08/09/11 at [facility]   peg out now-2014   PEG TUBE REMOVAL      Medications Current Outpatient Medications  Medication Sig Dispense Refill   butalbital-acetaminophen-caffeine (FIORICET/CODEINE) 50-325-40-30 MG capsule Take 1 capsule by mouth every 6 (six) hours as needed for headache. 6 capsule 0   cevimeline (EVOXAC) 30 MG capsule Take 1 capsule (30 mg total) by mouth 3 (three) times daily. 270 capsule 3   clotrimazole-betamethasone (LOTRISONE) cream Apply 1 application topically 2 (two) times daily. (Patient not taking: Reported on 05/03/2018) 30 g 3   divalproex (DEPAKOTE) 250 MG DR tablet take 1 tablet by mouth once daily (Patient not taking: Reported on 05/03/2018) 100 tablet 2   EPINEPHrine (EPIPEN) 0.3 mg/0.3 mL SOAJ injection Inject 0.3 mLs (0.3 mg total) into the muscle once. (Patient  not taking: Reported on 05/03/2018) 2 Device 3   LORazepam (ATIVAN) 1 MG tablet take 1 tablet by mouth if needed for anxiety (Patient not taking: Reported on 05/03/2018) 60 tablet 5   omeprazole (PRILOSEC) 20 MG capsule Take 1 capsule (20 mg total) by mouth 2 (two) times daily. 60 capsule 1   promethazine (PHENERGAN) 25 MG suppository Place 1 suppository (25 mg total) rectally every 6 (six) hours as needed for up to 7 days for nausea or vomiting. Migraines 6 suppository 1   promethazine  (PHENERGAN) 25 MG tablet Take 1 tablet (25 mg total) by mouth every 8 (eight) hours as needed for nausea or vomiting. 30 tablet 2   rizatriptan (MAXALT-MLT) 10 MG disintegrating tablet Take 1 tablet (10 mg total) by mouth as needed for migraine. May repeat in 2 hours if needed 10 tablet 5   No current facility-administered medications for this visit.    Allergies is allergic to naproxen sodium, other, bee venom, and zofran [ondansetron hcl].  Family History - reviewed per EMR and intake form  Social History   reports current alcohol use.  reports that he has never smoked. He has never used smokeless tobacco.  reports current drug use. OCCUPATION:  - previously in insurance - now Nurse, adult   EXAM: Vitals: BP (!) 151/88   Ht 5\' 9"  (1.753 m)   Wt 173 lb (78.5 kg)   BMI 25.55 kg/m  General: AOx3, NAD, pleasant SKIN: no rashes or lesions, skin clean, dry, intact MSK: Left knee with trace effusion.  No increased redness or warmth.  Near full range of motion with mild pain at terminal flexion.  Tender palpation along the medial joint line, no lateral joint line tenderness.  Negative McMurray, negative Lachman, negative varus/valgus stress. Right knee with full range of motion without pain, weakness, instability.  No effusion. Walking without a limp  NEURO: sensation intact to light touch lower extremity bilaterally VASC: pulses 2+ and symmetric DP/PT bilaterally, no edema  IMAGING: XRAYS:  Lt knee XR 06/09/2018 showing: -Minimal joint space narrowing, no other significant abnormalities -Images personally reviewed and interpreted by me during the visit today  Assessment & Plan Chronic pain of left knee Acute on chronic left knee pain with history of arthroscopy 2019 for complex medial meniscus tear with flipped segment.  Ongoing pain since that time.  Suspect underlying osteoarthritis, no new injury or trauma  Plan: -Previous office visit notes  with surgeon reviewed from 2019 -Reviewed previous left knee x-rays from 06/09/2018 as noted above showing minimal joint space narrowing -Imaging: Ordered left knee x-ray to assess overall degree of osteoarthritis -Discussed treatment options.  He is a candidate for cortisone injection today.  He like to proceed with this.  Risks and benefits reviewed.  Please see procedure note below -Recommend the use of a knee sleeve with activity.  He thinks he has 1 at home.  If not he will return to the office to get fitted with a Body helix -Can use heat or ice as needed -Over-the-counter Tylenol or ibuprofen as needed -Will reach out with x-ray results and discuss further follow-up.  PROCEDURE:  Risks & benefits of Lt knee cortisone injection reviewed. Consent obtained. Time-out completed. Patient prepped and draped in the normal fashion. Area cleansed with alcohol. Ethyl chloride spray used to anesthetize the skin. Solution of 4 mL 1% lidocaine with 1 mL methylprednisolone (Depo-medrol) 40mg /mL injected into the left knee using a 25-gauge 1.5-inch needle via the anterior  medial approach. Patient tolerated procedure well without any complications. Area covered with adhesive bandage.  Post-procedure care reviewed, all questions answered.   Patient expressed understanding & agreement with above.  Encounter Diagnosis  Name Primary?   Chronic pain of left knee Yes    Orders Placed This Encounter  Procedures   DG Knee 4 Views W/Patella Left    Orders Placed This Encounter  Procedures   DG Knee 4 Views W/Patella Left

## 2023-10-24 NOTE — Patient Instructions (Signed)

## 2023-10-28 DIAGNOSIS — M9901 Segmental and somatic dysfunction of cervical region: Secondary | ICD-10-CM | POA: Diagnosis not present

## 2023-10-28 DIAGNOSIS — M9907 Segmental and somatic dysfunction of upper extremity: Secondary | ICD-10-CM | POA: Diagnosis not present

## 2023-10-28 DIAGNOSIS — M9903 Segmental and somatic dysfunction of lumbar region: Secondary | ICD-10-CM | POA: Diagnosis not present

## 2023-10-28 DIAGNOSIS — M7541 Impingement syndrome of right shoulder: Secondary | ICD-10-CM | POA: Diagnosis not present

## 2023-11-04 ENCOUNTER — Telehealth: Payer: Self-pay

## 2023-11-04 NOTE — Telephone Encounter (Signed)
 Xray showed moderate arthritis. Pt states the injection has helped his knee pain. Will f/u in a few weeks with Dr. Christella Hartigan.

## 2023-11-07 DIAGNOSIS — M9907 Segmental and somatic dysfunction of upper extremity: Secondary | ICD-10-CM | POA: Diagnosis not present

## 2023-11-07 DIAGNOSIS — M9903 Segmental and somatic dysfunction of lumbar region: Secondary | ICD-10-CM | POA: Diagnosis not present

## 2023-11-07 DIAGNOSIS — M7541 Impingement syndrome of right shoulder: Secondary | ICD-10-CM | POA: Diagnosis not present

## 2023-11-07 DIAGNOSIS — M9901 Segmental and somatic dysfunction of cervical region: Secondary | ICD-10-CM | POA: Diagnosis not present

## 2023-11-24 ENCOUNTER — Ambulatory Visit: Payer: BC Managed Care – PPO | Admitting: Family Medicine

## 2023-11-24 VITALS — BP 150/95 | Ht 69.0 in | Wt 172.0 lb

## 2023-11-24 DIAGNOSIS — M7918 Myalgia, other site: Secondary | ICD-10-CM

## 2023-11-24 DIAGNOSIS — Z85819 Personal history of malignant neoplasm of unspecified site of lip, oral cavity, and pharynx: Secondary | ICD-10-CM | POA: Diagnosis not present

## 2023-11-24 DIAGNOSIS — M1712 Unilateral primary osteoarthritis, left knee: Secondary | ICD-10-CM

## 2023-11-24 NOTE — Assessment & Plan Note (Signed)
Improved left knee pain s/p cortisone injection 10/24/23. Updated X-rays reviewed and show progression of tricompartmental left knee OA to be moderate, most advanced in the medial compartment.   Plan: -Reviewed updated left knee x-rays as described above. -Did well with cortisone injection, discussed option to repeat these as soon as every 3 months. Discussed options for hyaluronic acid injections if inadequate long term response to cortisone. He may be interested in gel injections in the future. -Continue knee sleeve as needed -Provided home exercises focusing on quadriceps strengthening -Can use heat or ice as needed -Over-the-counter Tylenol or ibuprofen as needed

## 2023-11-24 NOTE — Assessment & Plan Note (Signed)
Diagnosed and treated with radiation and chemotherapy back in 2012. Has done well with surveillance per his report. Suspect previous radiation has played into his myofascial pain in the neck.  Plan: -Advised discussing neck stiffness with PCP at his next follow-up.

## 2023-11-24 NOTE — Progress Notes (Signed)
PCP: Etta Quill, PA-C  SUBJECTIVE:   HPI:  Patient is a 58 y.o. male here for follow-up of chronic left knee pain and also wants to discuss neck stiffness.  Lt knee pain for several years Bent over to pick up a broom at the time it flared in 2019 Prior knee surgery 2019 at [facility] with Dr. Cleophas Dunker -Knee arthroscopy for complex tear of the medial meniscus with flipped segment Received intraarticular cortisone injection last month for knee OA.  -Reports about 80% improvement since that shot. No longer constantly throbbing, now just intermittent after a good deal of activity.  -Got updated x-rays, we reviewed these today -Knee sleeve helpful -Denies mechanical sxs -Pleased with this progress  He also endorses worsening neck stiffness and soreness. Hx of oropharyngeal cancer, s/p radiation and chemotherapy in 2012. -States he was advised by his radiation oncologist he would eventually get stiffness -Denies any difficulty swallowing or weight loss -Locates stiffness to lateral aspect of neck, R>L, and bilateral traps -Does get some benefit with intermittent manipulations with his chiropractor, Dr Thereasa Distance -Interested to see if there are any stretches or therapies recommended   Pertinent ROS were reviewed with the patient and found to be negative unless otherwise specified above in HPI.   PERTINENT  PMH / PSH / FH / SH:  Past Medical, Surgical, Social, and Family History Reviewed & Updated in the EMR.  Pertinent findings include:  Non-contributory aside what is in HPi   Allergies  Allergen Reactions   Naproxen Sodium Anaphylaxis   Other Other (See Comments)    headache   Bee Venom Swelling   Zofran [Ondansetron Hcl]    OBJECTIVE:  BP (!) 150/95   Ht 5\' 9"  (1.753 m)   Wt 172 lb (78 kg)   BMI 25.40 kg/m   PHYSICAL EXAM:  GEN: Alert and Oriented, NAD, comfortable in exam room RESP: Unlabored respirations, symmetric chest rise PSY: normal mood, congruent  affect   MSK EXAM: Knee: No gross deformity, ecchymoses, swelling. Mild medial joint TTP, otherwise non-tender. FROM with normal strength. NV intact distally.  Neck: No deformity or masses. Midline trachea. FROM No midline or cervical TTP. He is tight and has palpable spasm in bilateral traps. Right SCM is mildly TTP, though no palpable spasms and has less density compared to left SCM No palpable lymph nodes. NVI distally.  Imaging: Left Knee X-ray 4-view 10/2023 reviewed with the patient and show progression of medial and lateral joint space narrowing, medial most prominently advanced. He does have degenerative spurring in all three compartments. Assessment & Plan Primary osteoarthritis of left knee Improved left knee pain s/p cortisone injection 10/24/23. Updated X-rays reviewed and show progression of tricompartmental left knee OA to be moderate, most advanced in the medial compartment.   Plan: -Reviewed updated left knee x-rays as described above. -Did well with cortisone injection, discussed option to repeat these as soon as every 3 months. Discussed options for hyaluronic acid injections if inadequate long term response to cortisone. He may be interested in gel injections in the future. -Continue knee sleeve as needed -Provided home exercises focusing on quadriceps strengthening -Can use heat or ice as needed -Over-the-counter Tylenol or ibuprofen as needed Myofascial pain Suspect his neck stiffness and soreness to be myofascial trigger points in his SCMs and Traps bilaterally. No red flag sxs or findings on exam today.  Plan: -Provided home exercises for neck stretches -Discussed consideration of dry needling with his chiropracter -Recommended PCP follow-up in-light  of his hx of throat cancer. Consideration of re-evaluation of additional stretches maybe with speech language pathology. History of oropharyngeal cancer Diagnosed and treated with radiation and chemotherapy  back in 2012. Has done well with surveillance per his report. Suspect previous radiation has played into his myofascial pain in the neck.  Plan: -Advised discussing neck stiffness with PCP at his next follow-up.   Glean Salen, MD PGY-4, Sports Medicine Fellow [facility]

## 2023-11-25 ENCOUNTER — Encounter: Payer: Self-pay | Admitting: Family Medicine

## 2023-11-28 DIAGNOSIS — Z1211 Encounter for screening for malignant neoplasm of colon: Secondary | ICD-10-CM | POA: Diagnosis not present

## 2023-12-15 DIAGNOSIS — M9902 Segmental and somatic dysfunction of thoracic region: Secondary | ICD-10-CM | POA: Diagnosis not present

## 2023-12-15 DIAGNOSIS — M25511 Pain in right shoulder: Secondary | ICD-10-CM | POA: Diagnosis not present

## 2023-12-15 DIAGNOSIS — M7541 Impingement syndrome of right shoulder: Secondary | ICD-10-CM | POA: Diagnosis not present

## 2023-12-15 DIAGNOSIS — M9903 Segmental and somatic dysfunction of lumbar region: Secondary | ICD-10-CM | POA: Diagnosis not present

## 2023-12-15 DIAGNOSIS — M9901 Segmental and somatic dysfunction of cervical region: Secondary | ICD-10-CM | POA: Diagnosis not present

## 2023-12-15 DIAGNOSIS — M9907 Segmental and somatic dysfunction of upper extremity: Secondary | ICD-10-CM | POA: Diagnosis not present

## 2023-12-15 DIAGNOSIS — M542 Cervicalgia: Secondary | ICD-10-CM | POA: Diagnosis not present

## 2023-12-15 DIAGNOSIS — M545 Low back pain, unspecified: Secondary | ICD-10-CM | POA: Diagnosis not present

## 2023-12-15 DIAGNOSIS — M9905 Segmental and somatic dysfunction of pelvic region: Secondary | ICD-10-CM | POA: Diagnosis not present

## 2023-12-21 DIAGNOSIS — I10 Essential (primary) hypertension: Secondary | ICD-10-CM | POA: Diagnosis not present

## 2023-12-21 DIAGNOSIS — Z6824 Body mass index (BMI) 24.0-24.9, adult: Secondary | ICD-10-CM | POA: Diagnosis not present

## 2023-12-21 DIAGNOSIS — L2489 Irritant contact dermatitis due to other agents: Secondary | ICD-10-CM | POA: Diagnosis not present

## 2023-12-22 DIAGNOSIS — M9907 Segmental and somatic dysfunction of upper extremity: Secondary | ICD-10-CM | POA: Diagnosis not present

## 2023-12-22 DIAGNOSIS — M9901 Segmental and somatic dysfunction of cervical region: Secondary | ICD-10-CM | POA: Diagnosis not present

## 2023-12-22 DIAGNOSIS — M9903 Segmental and somatic dysfunction of lumbar region: Secondary | ICD-10-CM | POA: Diagnosis not present

## 2023-12-22 DIAGNOSIS — M7541 Impingement syndrome of right shoulder: Secondary | ICD-10-CM | POA: Diagnosis not present

## 2024-01-03 DIAGNOSIS — M9903 Segmental and somatic dysfunction of lumbar region: Secondary | ICD-10-CM | POA: Diagnosis not present

## 2024-01-03 DIAGNOSIS — M9907 Segmental and somatic dysfunction of upper extremity: Secondary | ICD-10-CM | POA: Diagnosis not present

## 2024-01-03 DIAGNOSIS — M7541 Impingement syndrome of right shoulder: Secondary | ICD-10-CM | POA: Diagnosis not present

## 2024-01-03 DIAGNOSIS — M9901 Segmental and somatic dysfunction of cervical region: Secondary | ICD-10-CM | POA: Diagnosis not present

## 2024-01-18 DIAGNOSIS — M9907 Segmental and somatic dysfunction of upper extremity: Secondary | ICD-10-CM | POA: Diagnosis not present

## 2024-01-18 DIAGNOSIS — M9903 Segmental and somatic dysfunction of lumbar region: Secondary | ICD-10-CM | POA: Diagnosis not present

## 2024-01-18 DIAGNOSIS — M9901 Segmental and somatic dysfunction of cervical region: Secondary | ICD-10-CM | POA: Diagnosis not present

## 2024-01-18 DIAGNOSIS — M7541 Impingement syndrome of right shoulder: Secondary | ICD-10-CM | POA: Diagnosis not present

## 2024-01-26 DIAGNOSIS — M7541 Impingement syndrome of right shoulder: Secondary | ICD-10-CM | POA: Diagnosis not present

## 2024-01-26 DIAGNOSIS — M9907 Segmental and somatic dysfunction of upper extremity: Secondary | ICD-10-CM | POA: Diagnosis not present

## 2024-01-26 DIAGNOSIS — M9901 Segmental and somatic dysfunction of cervical region: Secondary | ICD-10-CM | POA: Diagnosis not present

## 2024-01-26 DIAGNOSIS — M9903 Segmental and somatic dysfunction of lumbar region: Secondary | ICD-10-CM | POA: Diagnosis not present

## 2024-02-02 DIAGNOSIS — M9903 Segmental and somatic dysfunction of lumbar region: Secondary | ICD-10-CM | POA: Diagnosis not present

## 2024-02-02 DIAGNOSIS — M9901 Segmental and somatic dysfunction of cervical region: Secondary | ICD-10-CM | POA: Diagnosis not present

## 2024-02-02 DIAGNOSIS — M7541 Impingement syndrome of right shoulder: Secondary | ICD-10-CM | POA: Diagnosis not present

## 2024-02-02 DIAGNOSIS — M9907 Segmental and somatic dysfunction of upper extremity: Secondary | ICD-10-CM | POA: Diagnosis not present

## 2024-02-14 DIAGNOSIS — M9901 Segmental and somatic dysfunction of cervical region: Secondary | ICD-10-CM | POA: Diagnosis not present

## 2024-02-14 DIAGNOSIS — M7541 Impingement syndrome of right shoulder: Secondary | ICD-10-CM | POA: Diagnosis not present

## 2024-02-14 DIAGNOSIS — M9907 Segmental and somatic dysfunction of upper extremity: Secondary | ICD-10-CM | POA: Diagnosis not present

## 2024-02-14 DIAGNOSIS — M9903 Segmental and somatic dysfunction of lumbar region: Secondary | ICD-10-CM | POA: Diagnosis not present

## 2024-03-23 DIAGNOSIS — R5381 Other malaise: Secondary | ICD-10-CM | POA: Diagnosis not present

## 2024-03-23 DIAGNOSIS — Z125 Encounter for screening for malignant neoplasm of prostate: Secondary | ICD-10-CM | POA: Diagnosis not present

## 2024-03-23 DIAGNOSIS — E039 Hypothyroidism, unspecified: Secondary | ICD-10-CM | POA: Diagnosis not present

## 2024-03-23 DIAGNOSIS — R5383 Other fatigue: Secondary | ICD-10-CM | POA: Diagnosis not present

## 2024-03-23 DIAGNOSIS — E559 Vitamin D deficiency, unspecified: Secondary | ICD-10-CM | POA: Diagnosis not present

## 2024-06-19 DIAGNOSIS — M7541 Impingement syndrome of right shoulder: Secondary | ICD-10-CM | POA: Diagnosis not present

## 2024-06-19 DIAGNOSIS — M9907 Segmental and somatic dysfunction of upper extremity: Secondary | ICD-10-CM | POA: Diagnosis not present

## 2024-06-19 DIAGNOSIS — M9901 Segmental and somatic dysfunction of cervical region: Secondary | ICD-10-CM | POA: Diagnosis not present

## 2024-06-19 DIAGNOSIS — M9903 Segmental and somatic dysfunction of lumbar region: Secondary | ICD-10-CM | POA: Diagnosis not present

## 2024-06-22 ENCOUNTER — Ambulatory Visit (INDEPENDENT_AMBULATORY_CARE_PROVIDER_SITE_OTHER): Admitting: Family Medicine

## 2024-06-22 ENCOUNTER — Encounter: Payer: Self-pay | Admitting: Family Medicine

## 2024-06-22 VITALS — BP 132/88 | Ht 69.0 in | Wt 172.0 lb

## 2024-06-22 DIAGNOSIS — M1712 Unilateral primary osteoarthritis, left knee: Secondary | ICD-10-CM

## 2024-06-22 MED ORDER — METHYLPREDNISOLONE ACETATE 40 MG/ML IJ SUSP
40.0000 mg | Freq: Once | INTRAMUSCULAR | Status: AC
  Administered 2024-06-22: 40 mg via INTRA_ARTICULAR

## 2024-06-22 NOTE — Progress Notes (Signed)
 Sports Medicine Center Attending Note: I have seen and examined this patient with the medical student. I have  reviewed the history, physical examination, assessment and plan as documented in the medical student's note.  I agree with the medical student's note and findings, assessment and treatment plan as documented with the following additions or changes:   PROCEDURE: INJECTION: Patient was given informed consent, signed copy in the chart. Appropriate time out was taken. Area prepped and draped in usual sterile fashion. Ethyl chloride was  used for local anesthesia. A 21 gauge 1 1/2 inch needle was used.. 1 cc of methylprednisolone  40 mg/ml plus  4 cc of 1% lidocaine  without epinephrine  was injected into the left using a(n) anterior medial approach.   The patient tolerated the procedure well. There were no complications. Post procedure instructions were given.

## 2024-06-22 NOTE — Progress Notes (Signed)
 PCP: Dorise Toribio PARAS, PA-C  Subjective:   HPI: Patient is a 58 y.o. male here for follow-up evaluation of left knee pain, he was last seen in clinic on 11/24/2023.  Patient reports that his knee pain gradually started to return in March of this past year and has been slowly progressing. He received a cortisone injection in December of 2024 that lastly roughly 3 months before he began to notice symptoms again. He says that his knee pain is worse with activity and that he stays fairly active. He wears a supportive knee sleeve and takes Ibuprofen as needed.   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 6 capsule 0   cevimeline  (EVOXAC ) 30 MG capsule Take 1 capsule (30 mg total) by mouth 3 (three) times daily. 270 capsule 3   clotrimazole -betamethasone  (LOTRISONE ) cream Apply 1 application topically 2 (two) times daily. (Patient not taking: Reported on 05/03/2018) 30 g 3   divalproex  (DEPAKOTE ) 250 MG DR tablet take 1 tablet by mouth once daily (Patient not taking: Reported on 05/03/2018) 100 tablet 2   EPINEPHrine  (EPIPEN ) 0.3 mg/0.3 mL SOAJ injection Inject 0.3 mLs (0.3 mg total)  into the muscle once. (Patient not taking: Reported on 05/03/2018) 2 Device 3   LORazepam  (ATIVAN ) 1 MG tablet take 1 tablet by mouth if needed for anxiety (Patient not taking: Reported on 05/03/2018) 60 tablet 5   omeprazole  (PRILOSEC) 20 MG capsule Take 1 capsule (20 mg total) by mouth 2 (two) times daily. 60 capsule 1   promethazine  (PHENERGAN ) 25 MG suppository Place 1 suppository (25 mg total) rectally every 6 (six) hours as needed for up to 7 days for nausea or vomiting. Migraines 6 suppository 1   promethazine  (PHENERGAN ) 25 MG tablet Take 1 tablet (25 mg total) by mouth every 8 (eight) hours as needed for nausea or vomiting. 30 tablet 2   rizatriptan  (MAXALT -MLT) 10 MG disintegrating tablet Take 1 tablet (10 mg total) by mouth as needed for migraine. May repeat in 2 hours if needed 10 tablet 5   No current facility-administered medications on file prior to visit.    Past Surgical History:  Procedure Laterality Date   ESOPHAGEAL DILATION  09/11/2012   Procedure: ESOPHAGEAL DILATION;  Surgeon: Lonni FORBES Angle, MD;  Location: [facility];  Service: ENT;  Laterality: Bilateral;  ESOPHAGOSCOPY WITH SAVORY DILATORS    ESOPHAGEAL DILATION N/A 04/17/2013   Procedure: ESOPHAGEAL DILATION;  Surgeon: Lonni FORBES Angle, MD;  Location: [facility];  Service: ENT;  Laterality: N/A;   ESOPHAGEAL DILATION N/A 01/22/2014   Procedure: ESOPHAGEAL DILATION WITH  THE SAVORY DILATORS;  Surgeon: Lonni FORBES Angle, MD;  Location: [facility];  Service: ENT;  Laterality: N/A;   LAPAROSCOPIC APPENDECTOMY  07/05/2012   Procedure: APPENDECTOMY LAPAROSCOPIC;  Surgeon: Debby LABOR. Cornett, MD;  Location: [facility];  Service: General;  Laterality: N/A;   NASAL SINUS SURGERY     PEG TUBE PLACEMENT  08/09/11 at [facility]   peg out now-2014   PEG TUBE REMOVAL      Allergies  Allergen Reactions   Naproxen Sodium Anaphylaxis   Other Other (See Comments)    headache   Bee Venom  Swelling   Zofran  [Ondansetron  Hcl]     BP 132/88 (BP Location: Left Arm, Patient Position: Sitting)   Ht 5' 9 (1.753 m)   Wt 172 lb (78 kg)   BMI 25.40 kg/m       No data to display              No data to display              Objective:  Physical Exam:  Gen: NAD, comfortable in exam room  MSK: Left knee Inspection: No bony or soft tissue abnormalities appreciated. Appears symmetrical in comparison to right knee.  Palpation: There is tenderness to palpation along the medial joint line.  ROM: FROM with knee extension/flexion Strength: 5/5 with flexion/extension, dorsiflexion/plantar flexion, and hip flexion. Neuro/Vasc: Neurovascularly intact distally.   10/24/2023 X-ray Left Knee: Mild-to-moderate degenerative changes of the left knee, worst in the medial compartment.    Assessment & Plan:  1. Osteoarthritis of left knee  - Patient's left knee pain due to underlying osteoarthritis responded well to previous steroid injection in December of 2024. Discussed with patient that we can repeat an injection today for relief of symptoms. Patient is amenable to this plan.     Signe Ravel, MS4 [facility]

## 2024-06-28 DIAGNOSIS — M7541 Impingement syndrome of right shoulder: Secondary | ICD-10-CM | POA: Diagnosis not present

## 2024-06-28 DIAGNOSIS — M9901 Segmental and somatic dysfunction of cervical region: Secondary | ICD-10-CM | POA: Diagnosis not present

## 2024-06-28 DIAGNOSIS — M9903 Segmental and somatic dysfunction of lumbar region: Secondary | ICD-10-CM | POA: Diagnosis not present

## 2024-06-28 DIAGNOSIS — M9907 Segmental and somatic dysfunction of upper extremity: Secondary | ICD-10-CM | POA: Diagnosis not present

## 2024-09-20 DIAGNOSIS — M9901 Segmental and somatic dysfunction of cervical region: Secondary | ICD-10-CM | POA: Diagnosis not present

## 2024-09-20 DIAGNOSIS — M9903 Segmental and somatic dysfunction of lumbar region: Secondary | ICD-10-CM | POA: Diagnosis not present

## 2024-09-20 DIAGNOSIS — M7541 Impingement syndrome of right shoulder: Secondary | ICD-10-CM | POA: Diagnosis not present

## 2024-09-20 DIAGNOSIS — M9907 Segmental and somatic dysfunction of upper extremity: Secondary | ICD-10-CM | POA: Diagnosis not present
